# Patient Record
Sex: Female | Born: 1995 | Race: Black or African American | Hispanic: No | Marital: Single | Smoking: Former smoker
Health system: Southern US, Community
[De-identification: ages and names within clinical notes are randomized; demographics above are authoritative.]

## PROBLEM LIST (undated history)

## (undated) DIAGNOSIS — Z789 Other specified health status: Secondary | ICD-10-CM

## (undated) DIAGNOSIS — B3731 Acute candidiasis of vulva and vagina: Secondary | ICD-10-CM

## (undated) DIAGNOSIS — B373 Candidiasis of vulva and vagina: Secondary | ICD-10-CM

## (undated) HISTORY — PX: NO PAST SURGERIES: SHX2092

---
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
## 2013-06-23 ENCOUNTER — Emergency Department (HOSPITAL_COMMUNITY)
Admission: EM | Admit: 2013-06-23 | Discharge: 2013-06-23 | Disposition: A | Discharge status: Home / Self Care | Payer: No Typology Code available for payment source | Attending: Emergency Medicine | Admitting: Emergency Medicine

## 2013-06-23 ENCOUNTER — Encounter (HOSPITAL_COMMUNITY): Payer: Self-pay | Admitting: Emergency Medicine

## 2013-06-23 DIAGNOSIS — S0990XA Unspecified injury of head, initial encounter: Secondary | ICD-10-CM | POA: Insufficient documentation

## 2013-06-23 DIAGNOSIS — IMO0002 Reserved for concepts with insufficient information to code with codable children: Secondary | ICD-10-CM | POA: Insufficient documentation

## 2013-06-23 DIAGNOSIS — Y9389 Activity, other specified: Secondary | ICD-10-CM | POA: Insufficient documentation

## 2013-06-23 DIAGNOSIS — Y92410 Unspecified street and highway as the place of occurrence of the external cause: Secondary | ICD-10-CM | POA: Insufficient documentation

## 2013-06-23 MED ORDER — CYCLOBENZAPRINE HCL 10 MG PO TABS: 10.0000 mg | ORAL_TABLET | Freq: Two times a day (BID) | ORAL | Status: DC | PRN

## 2013-06-23 MED ORDER — ACETAMINOPHEN 500 MG PO TABS: 500.0000 mg | ORAL_TABLET | Freq: Four times a day (QID) | ORAL | Status: DC | PRN
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
## 2013-06-24 ENCOUNTER — Telehealth (HOSPITAL_COMMUNITY): Payer: Self-pay | Admitting: Emergency Medicine

## 2013-06-24 NOTE — ED Provider Notes (Signed)
Medical screening examination/treatment/procedure(s) were performed by non-physician practitioner and as supervising physician I was immediately available for consultation/collaboration.   Roney Marion, MD 06/24/13 2213

## 2014-02-06 ENCOUNTER — Encounter (HOSPITAL_COMMUNITY): Payer: Self-pay | Admitting: Emergency Medicine

## 2014-02-06 ENCOUNTER — Emergency Department (HOSPITAL_COMMUNITY)
Admission: EM | Admit: 2014-02-06 | Discharge: 2014-02-06 | Disposition: A | Discharge status: Home / Self Care | Payer: No Typology Code available for payment source | Attending: Emergency Medicine | Admitting: Emergency Medicine

## 2014-02-06 DIAGNOSIS — B9689 Other specified bacterial agents as the cause of diseases classified elsewhere: Secondary | ICD-10-CM | POA: Insufficient documentation

## 2014-02-06 DIAGNOSIS — D649 Anemia, unspecified: Secondary | ICD-10-CM

## 2014-02-06 DIAGNOSIS — N760 Acute vaginitis: Secondary | ICD-10-CM | POA: Insufficient documentation

## 2014-02-06 DIAGNOSIS — N926 Irregular menstruation, unspecified: Secondary | ICD-10-CM | POA: Insufficient documentation

## 2014-02-06 DIAGNOSIS — N939 Abnormal uterine and vaginal bleeding, unspecified: Secondary | ICD-10-CM | POA: Insufficient documentation

## 2014-02-06 DIAGNOSIS — A499 Bacterial infection, unspecified: Secondary | ICD-10-CM | POA: Insufficient documentation

## 2014-02-06 DIAGNOSIS — Z3202 Encounter for pregnancy test, result negative: Secondary | ICD-10-CM | POA: Insufficient documentation

## 2014-02-06 LAB — CBC WITH DIFFERENTIAL/PLATELET
BASOS ABS: 0 10*3/uL (ref 0.0–0.1)
BASOS PCT: 0 % (ref 0–1)
Eosinophils Absolute: 0.1 10*3/uL (ref 0.0–0.7)
Eosinophils Relative: 1 % (ref 0–5)
HEMATOCRIT: 32.6 % — AB (ref 36.0–46.0)
Hemoglobin: 10.6 g/dL — ABNORMAL LOW (ref 12.0–15.0)
Lymphocytes Relative: 41 % (ref 12–46)
Lymphs Abs: 2.6 10*3/uL (ref 0.7–4.0)
MCH: 27.8 pg (ref 26.0–34.0)
MCHC: 32.5 g/dL (ref 30.0–36.0)
MCV: 85.6 fL (ref 78.0–100.0)
MONO ABS: 0.4 10*3/uL (ref 0.1–1.0)
Monocytes Relative: 6 % (ref 3–12)
NEUTROS ABS: 3.3 10*3/uL (ref 1.7–7.7)
NEUTROS PCT: 52 % (ref 43–77)
PLATELETS: 191 10*3/uL (ref 150–400)
RBC: 3.81 MIL/uL — ABNORMAL LOW (ref 3.87–5.11)
RDW: 17.3 % — AB (ref 11.5–15.5)
WBC: 6.4 10*3/uL (ref 4.0–10.5)

## 2014-02-06 LAB — WET PREP, GENITAL
Trich, Wet Prep: NONE SEEN
YEAST WET PREP: NONE SEEN

## 2014-02-06 LAB — COMPREHENSIVE METABOLIC PANEL
ALBUMIN: 3.9 g/dL (ref 3.5–5.2)
ALT: 10 U/L (ref 0–35)
AST: 21 U/L (ref 0–37)
Alkaline Phosphatase: 72 U/L (ref 39–117)
BILIRUBIN TOTAL: 0.3 mg/dL (ref 0.3–1.2)
BUN: 10 mg/dL (ref 6–23)
CHLORIDE: 102 meq/L (ref 96–112)
CO2: 25 mEq/L (ref 19–32)
CREATININE: 0.72 mg/dL (ref 0.50–1.10)
Calcium: 9.5 mg/dL (ref 8.4–10.5)
GFR calc Af Amer: >90 mL/min (ref >90)
GFR calc non Af Amer: >90 mL/min (ref >90)
Glucose, Bld: 87 mg/dL (ref 70–99)
Potassium: 3.7 mEq/L (ref 3.7–5.3)
SODIUM: 138 meq/L (ref 137–147)
Total Protein: 7.4 g/dL (ref 6.0–8.3)

## 2014-02-06 LAB — LIPASE, BLOOD: Lipase: 36 U/L (ref 11–59)

## 2014-02-06 LAB — URINALYSIS, ROUTINE W REFLEX MICROSCOPIC
Bilirubin Urine: NEGATIVE
Glucose, UA: NEGATIVE mg/dL
Hgb urine dipstick: NEGATIVE
Ketones, ur: NEGATIVE mg/dL
Leukocytes, UA: NEGATIVE
Nitrite: NEGATIVE
Protein, ur: NEGATIVE mg/dL
Specific Gravity, Urine: 1.023 (ref 1.005–1.030)
Urobilinogen, UA: 0.2 mg/dL (ref 0.0–1.0)
pH: 7.5 (ref 5.0–8.0)

## 2014-02-06 LAB — POC URINE PREG, ED: Preg Test, Ur: NEGATIVE

## 2014-02-06 MED ORDER — METRONIDAZOLE 500 MG PO TABS: 500.0000 mg | ORAL_TABLET | Freq: Two times a day (BID) | ORAL | Status: DC

## 2014-02-06 NOTE — Progress Notes (Signed)
[facility] provided pt with a list of primary care resources, highlighting [facility].  °

## 2014-02-06 NOTE — ED Notes (Addendum)
Pt reports that she had light period prior to this one, but now has been having vaginal bleeding for 3 weeks with L side pain. Denies lightheadedness. Has been having diarrhea for 2-3 days.

## 2014-02-06 NOTE — ED Provider Notes (Signed)
CSN: [id number]     Arrival date & time 02/06/14  1340 History   First MD Initiated Contact with Patient 02/06/14 1504     Chief Complaint  Patient presents with  . Vaginal Bleeding  . Abdominal Pain     (Consider location/radiation/quality/duration/timing/severity/associated sxs/prior Treatment) HPI  Patient presents with abnormal vaginal bleeding.  States she had some spotting followed by her normal period April 22, she now has had constant vaginal bleeding for the past 3 weeks.  Bleeding is at times very light and at times like a normal period, currently it is very light.  She is using tampons, changing every 3-4 hours, very light.  Occasional sharp abdominal pains, occuring about twice daily, sharp pain, lasting seconds.  Denies fevers, lightheadedness, dizziness, weakness, SOB.  Denies abnormal vaginal discharge besides the bleeding.  Denies urinary or bowel symptoms.  She is sexually active, uses condoms only for protection.   History reviewed. No pertinent past medical history. History reviewed. No pertinent past surgical history. History reviewed. No pertinent family history. History  Substance Use Topics  . Smoking status: Never Smoker   . Smokeless tobacco: Never Used  . Alcohol Use: No   OB History   Grav Para Term Preterm Abortions TAB SAB Ect Mult Living                 Review of Systems  All other systems reviewed and are negative.     Allergies  Aspirin  Home Medications   Prior to Admission medications   Medication Sig Start Date End Date Taking? Authorizing Provider  ibuprofen (ADVIL,MOTRIN) 200 MG tablet Take 400 mg by mouth every 6 (six) hours as needed for moderate pain.   Yes Historical Provider, MD   BP 119/81  Pulse 117  Temp(Src) 98.1 F (36.7 C) (Oral)  Resp 20  SpO2 100% Physical Exam  Nursing note and vitals reviewed. Constitutional: She appears well-developed and well-nourished. No distress.  HENT:  Head: Normocephalic and atraumatic.   Neck: Neck supple.  Cardiovascular: Normal rate and regular rhythm.   Pulmonary/Chest: Effort normal and breath sounds normal. No respiratory distress. She has no wheezes. She has no rales.  Abdominal: Soft. She exhibits no distension and no mass. There is no tenderness. There is no rebound, no guarding and no CVA tenderness.  Genitourinary: Uterus is not tender. Cervix exhibits no motion tenderness and no friability. Right adnexum displays no mass, no tenderness and no fullness. Left adnexum displays no mass, no tenderness and no fullness. There is bleeding around the vagina. No erythema or tenderness around the vagina. No foreign body around the vagina. No signs of injury around the vagina. No vaginal discharge found.  Small amount of red blood and small amount of mucus   Neurological: She is alert.  Skin: She is not diaphoretic.  Psychiatric: She has a normal mood and affect. Her behavior is normal.    ED Course  Procedures (including critical care time) Labs Review Labs Reviewed  WET PREP, GENITAL - Abnormal; Notable for the following:    Clue Cells Wet Prep HPF POC MANY (*)    WBC, Wet Prep HPF POC FEW (*)    All other components within normal limits  CBC WITH DIFFERENTIAL - Abnormal; Notable for the following:    RBC 3.81 (*)    Hemoglobin 10.6 (*)    HCT 32.6 (*)    RDW 17.3 (*)    All other components within normal limits  GC/CHLAMYDIA PROBE AMP  URINALYSIS, ROUTINE W REFLEX MICROSCOPIC  COMPREHENSIVE METABOLIC PANEL  LIPASE, BLOOD  HIV ANTIBODY (ROUTINE TESTING)  POC URINE PREG, ED    Imaging Review No results found.   EKG Interpretation None      Filed Vitals:   02/06/14 1548  BP: 109/69  Pulse: 63  Temp: 98 F (36.7 C)  Resp: 16     MDM   Final diagnoses:  Abnormal vaginal bleeding  Anemia  BV (bacterial vaginosis)    Pt with abnormal vaginal bleeding.  She is not pregnant.  She is anemic, Hgb 10.6 with no prior value available to me. VS normal.  Wet prep + for BV.  D/C home with flagyl, [facility]  Discussed result, findings, treatment, and follow up  with patient.  Pt given return precautions.  Pt verbalizes understanding and agrees with plan.        Trixie Dredgemily Filiberto Wamble, PA-C 02/06/14 2158

## 2014-02-06 NOTE — ED Provider Notes (Signed)
Medical screening examination/treatment/procedure(s) were performed by non-physician practitioner and as supervising physician I was immediately available for consultation/collaboration.   EKG Interpretation None        Dagmar Hait, MD 02/06/14 [phone]

## 2014-02-06 NOTE — Discharge Instructions (Signed)
Read the information below.  Use the prescribed medication as directed.  Please discuss all new medications with your pharmacist.  You may return to the Emergency Department at any time for worsening condition or any new symptoms that concern you.  If you develop high fevers, worsening abdominal pain, uncontrolled bleeding, you feel lightheaded, weak, or you pass out, go directly to [facility] for a recheck.     Abnormal Uterine Bleeding Abnormal uterine bleeding means bleeding from the vagina that is not your normal menstrual period. This can be:  Bleeding or spotting between periods.  Bleeding after sex (sexual intercourse).  Bleeding that is heavier or more than normal.  Periods that last longer than usual.  Bleeding after menopause. There are many problems that may cause this. Treatment will depend on the cause of the bleeding. Any kind of bleeding that is not normal should be reviewed by your doctor.  HOME CARE Watch your condition for any changes. These actions may lessen any discomfort you are having:  Do not use tampons or douches as told by your doctor.  Change your pads often. You should get regular pelvic exams and Pap tests. Keep all appointments for tests as told by your doctor. GET HELP IF:  You are bleeding for more than 1 week.  You feel dizzy at times. GET HELP RIGHT AWAY IF:   You pass out.  You have to change pads every 15 to 30 minutes.  You have belly pain.  You have a fever.  You become sweaty or weak.  You are passing large blood clots from the vagina.  You feel sick to your stomach (nauseous) and throw up (vomit). MAKE SURE YOU:  Understand these instructions.  Will watch your condition.  Will get help right away if you are not doing well or get worse. Document Released: 06/25/2009 Document Revised: 06/18/2013 Document Reviewed: 03/27/2013 [facility] Patient Information 2014 [place].  Bacterial Vaginosis Bacterial vaginosis  is a vaginal infection that occurs when the normal balance of bacteria in the vagina is disrupted. It results from an overgrowth of certain bacteria. This is the most common vaginal infection in women of childbearing age. Treatment is important to prevent complications, especially in pregnant women, as it can cause a premature delivery. CAUSES  Bacterial vaginosis is caused by an increase in harmful bacteria that are normally present in smaller amounts in the vagina. Several different kinds of bacteria can cause bacterial vaginosis. However, the reason that the condition develops is not fully understood. RISK FACTORS Certain activities or behaviors can put you at an increased risk of developing bacterial vaginosis, including:  Having a new sex partner or multiple sex partners.  Douching.  Using an intrauterine device (IUD) for contraception. Women do not get bacterial vaginosis from toilet seats, bedding, swimming pools, or contact with objects around them. SIGNS AND SYMPTOMS  Some women with bacterial vaginosis have no signs or symptoms. Common symptoms include:  Grey vaginal discharge.  A fishlike odor with discharge, especially after sexual intercourse.  Itching or burning of the vagina and vulva.  Burning or pain with urination. DIAGNOSIS  Your health care provider will take a medical history and examine the vagina for signs of bacterial vaginosis. A sample of vaginal fluid may be taken. Your health care provider will look at this sample under a microscope to check for bacteria and abnormal cells. A vaginal pH test may also be done.  TREATMENT  Bacterial vaginosis may be treated with antibiotic medicines.  These may be given in the form of a pill or a vaginal cream. A second round of antibiotics may be prescribed if the condition comes back after treatment.  HOME CARE INSTRUCTIONS   Only take over-the-counter or prescription medicines as directed by your health care provider.  If  antibiotic medicine was prescribed, take it as directed. Make sure you finish it even if you start to feel better.  Do not have sex until treatment is completed.  Tell all sexual partners that you have a vaginal infection. They should see their health care provider and be treated if they have problems, such as a mild rash or itching.  Practice safe sex by using condoms and only having one sex partner. SEEK MEDICAL CARE IF:   Your symptoms are not improving after 3 days of treatment.  You have increased discharge or pain.  You have a fever. MAKE SURE YOU:   Understand these instructions.  Will watch your condition.  Will get help right away if you are not doing well or get worse. FOR MORE INFORMATION  Centers for Disease Control and Prevention, Division of STD Prevention: SolutionApps.co.za American Sexual Health Association (ASHA): www.ashastd.org  Document Released: 08/28/2005 Document Revised: 06/18/2013 Document Reviewed: 04/09/2013 [facility] Patient Information 2014 [place], [place].   Emergency Department Resource Guide 1) Find a Doctor and Pay Out of Pocket Although you won't have to find out who is covered by your insurance plan, it is a good idea to ask around and get recommendations. You will then need to call the office and see if the doctor you have chosen will accept you as a new patient and what types of options they offer for patients who are self-pay. Some doctors offer discounts or will set up payment plans for their patients who do not have insurance, but you will need to ask so you aren't surprised when you get to your appointment.  2) Contact Your Local Health Department Not all health departments have doctors that can see patients for sick visits, but many do, so it is worth a call to see if yours does. If you don't know where your local health department is, you can check in your phone book. The CDC also has a tool to help you locate your state's health department,  and many state websites also have listings of all of their local health departments.  3) Find a Walk-in Clinic If your illness is not likely to be very severe or complicated, you may want to try a walk in clinic. These are popping up all over the country in pharmacies, drugstores, and shopping centers. They're usually staffed by nurse practitioners or physician assistants that have been trained to treat common illnesses and complaints. They're usually fairly quick and inexpensive. However, if you have serious medical issues or chronic medical problems, these are probably not your best option.  No Primary Care Doctor: - Call Health Connect at  [phone] - they can help you locate a primary care doctor that  accepts your insurance, provides certain services, etc. - Physician Referral Service- [phone]  Chronic Pain Problems: Organization         Address  Phone   Notes  [facility]  [phone] Patients need to be referred by their primary care doctor.   Medication Assistance: Organization         Address  Phone   Notes  [facility] Medication Assistance Program [address] [place], [place] [address] [phone]  [phone] --Must be a resident of [facility] -- Must have NO insurance coverage whatsoever (no Medicaid/ Medicare, etc.) -- The pt. MUST have a primary care doctor that directs their care regularly and follows them in the community   [facility]ist  [phone]   [employer]  [phone]    Agencies that provide inexpensive medical care: Organization         Address  Phone   Notes  [facility]  [phone]   [facility]    [phone]   [facility] [address] [place], [place] [address] [phone]   [facility] [address] [place] [place] [phone]   Planned Parenthood    [phone]   [facility]    [phone]   Community  Health and [facility]  [address], [place] Babinski [place] Phone:  [phone], Fax:  [phone] Hours of Operation:  9 am - 6 pm, M-F.  Also accepts Medicaid/Medicare and self-pay.  [facility]  [address], [place] Phone: [phone], Fax: [phone]. Hours of Operation:  8:30 am - 5:30 pm, M-F.  Also accepts Medicaid and self-pay.  [facility] [place] [address], [place] Phone: [phone]   [facility] [address] Natasha Bence [place], [place] [phone], Ext. [phone] Mondays & Thursdays: 7-9 AM.  First 15 patients are seen on a first come, first serve basis.    Medicaid-accepting [facility] Providers:  Organization         Address  Phone   Notes  [facility] [address], [place] [phone] Also accepts self-pay patients.  [facility] [address] Laurell Josephs [place], [place]  [phone]   [facility] [address], [place] [phone]   [facility] Medicine [address], [place] [phone]   Renaye Rakers [address], [place]   [phone] Only accepts [place] Access [place] patients after they have their name applied to their card.   Self-Pay (no insurance) in [facility]:  Organization         Address  Phone   Notes  Sickle Cell Patients, [facility] [address] [place], [place] [phone]   [facility] Care [address] [place], [place] [phone]   [facility]  [address], [place] [phone]   [facility]/Dr. Osei-Bonsu  [address] [place] or [address], [place] [phone] Phone number for both [place] and [place] locations is the same.  Urgent Medical and [facility] [address], [place] [phone]   [facility] [address] [place] or [address] [phone]   [facility] [address] [place] [phone], phone; [phone], fax Sees patients 1st and 3rd Saturday of every month.  Must not qualify for public or private insurance (i.e. Medicaid, Medicare, [place] Health Choice, Veterans' Benefits)  Household income should be no more than 200% of the poverty level The clinic cannot treat you if you are pregnant or think you are pregnant  Sexually transmitted diseases are not treated at the clinic.    Dental Care: Organization  Address  Phone  Notes  [facility] Department of [facility] [address] [place], [place] [phone] Accepts children up to age 58 who are enrolled in [place] or [place] Health Choice; pregnant women with a Medicaid card; and children who have applied for Medicaid or [place] Health Choice, but were declined, whose parents can pay a reduced fee at time of service.  [facility] Department of [facility]  [address], [place] [phone] Accepts children up to age 37 who are enrolled in [place] or [place] Health Choice; pregnant women with a Medicaid card; and children who have applied for Medicaid or [place] Health Choice, but were declined, whose parents can pay a reduced fee at time of service.  [facility]  [address] [place], [place] [phone] Patients are seen by appointment only. Walk-ins are not accepted. [facility] will see patients 36 years of age and older. Monday - Tuesday (8am-5pm) Most Wednesdays (8:30-5pm) $30 per visit, cash only  [facility] Adult Dental Access PRO[facility]  [address], [facility] [phone] Patients are seen by appointment only. Walk-ins are not accepted. [facility] will see patients 43 years of age and older. One Wednesday Evening (Monthly: Volunteer Based).  $30 per visit,  cash only  [employer]  [phone] for adults; Children under age 34, call Graduate Pediatric Dentistry at [phone]. Children aged 16-14, please call [phone] to request a pediatric application.  Dental services are provided in all areas of dental care including fillings, crowns and bridges, complete and partial dentures, implants, gum treatment, root canals, and extractions. Preventive care is also provided. Treatment is provided to both adults and children. Patients are selected via a lottery and there is often a waiting list.   [facility] [address], [employer]  [phone] www.drcivils.com   [facility] [address] [place], [place] [phone], Ext. [phone] Second and Fourth Thursday of each month, opens at 6:30 AM; Clinic ends at 9 AM.  Patients are seen on a first-come first-served basis, and a limited number are seen during each clinic.   [facility]  [address] Ether Griffins [place], [place] [phone]   Eligibility Requirements You must have lived in [place], [place], or [place] counties for at least the last three months.   You cannot be eligible for state or federal sponsored [place], including [employer], [place], or [employer].   You generally cannot be eligible for healthcare insurance through your employer.    How to apply: Eligibility screenings are held every Tuesday and Wednesday afternoon from 1:00 pm until 4:00 pm. You do not need an appointment for the interview!  [facility] [address] [place], [place] [phone]   [facility] Health Department  [phone]   [facility] Health Department  [phone]   [facility] Health Department  [phone]    Behavioral Health Resources in the Community: Intensive Outpatient Programs Organization         Address  Phone  Notes  [facility] Services [address],  [place], [place] [phone]   [facility] Outpatient [address], [place], [place] [phone]   [facility]: [facility] [address], [place], [place]  [phone]   [facility] Mental Health [address],  [place], [place] [phone] or [phone]   Substance Abuse Resources  Organization         Address  Phone  Notes  Al[facility] Drug Services  [phone]   [facility]  [phone]   [facility] [place]  [phone]   Floydene Flock  [phone]   Residential & Outpatient Substance Abuse Program  [phone]   Psychological Services Organization         Address  Phone  Notes  [facility] Behavior[facility] Health  [phone]   [facility] Services  [phone]   [facility] Mental Health [address], [place] [phone] or [phone]    Mobile Crisis Teams Organization         Address  Phone  Notes  [facility], Mobile Crisis Care Unit  [phone]   [facility]  [address] [place], [place] [phone]   Doristine Locks [address] [place] [phone]    Self-Help/Support Groups Organization         Address  Phone             Notes  [facility] [place] - variety of support groups  [phone]- I7437963 Call for more information  Narcotics Anonymous (NA), [facility] [address], [employer] [place]  2 meetings at this location   Statistician         Address  Phone  Notes  [facility] [address],    [place]  [phone]   [facility]  [address] [place] [address], [place], [place] [phone]   [facility] [address] [place], [place] [phone] Admissions: 8am-3pm M-F  [facility] [address]    [place], [place] [phone]   [facility] [address] [place], [place], [place] [phone]   [facility] [address]  [place], [place] [phone]   [facility] - Intensive Outpatient [address] Laurell Josephs [address], [place], [place] [phone]   [facility] ([facility] [address] [place].,  [place], [place] [phone] or [phone]   [facility] ([facility]) [address], [place], [place] [phone] Accepts Medicaid  [facility] [address],  [place] [phone] Substance Abuse/Addiction Treatment   [facility] Organization         Address  Phone  Notes  [facility]  [phone]   Angie Fava, PhD [address] Ervin Knack [place], [place]   [phone] or [phone]   [facility]al   [address] [place], [place] [phone]   [facility] [address], [place], [place] [phone] Insurance/Medicaid/sponsorship through [facility] and Families [address]                                    [place], [place] [phone] Therapy/tele-psych/case  [facility] [address] [place] [address], [place] [phone]    Dr. Lolly Mustache  [phone]   [facility]  [employer] [facility] Dept. 1) [address], [place] 2) [address] [place] 3)  [address], [place] [phone]  [phone]   [facility] Child Abuse Hotline 2516076368 or 7637086557 (After Hours)

## 2014-02-07 LAB — GC/CHLAMYDIA PROBE AMP
CT Probe RNA: POSITIVE — AB
GC Probe RNA: NEGATIVE

## 2014-02-07 LAB — HIV ANTIBODY (ROUTINE TESTING W REFLEX): HIV 1&2 Ab, 4th Generation: NONREACTIVE

## 2014-02-08 ENCOUNTER — Telehealth (HOSPITAL_BASED_OUTPATIENT_CLINIC_OR_DEPARTMENT_OTHER): Payer: Self-pay | Admitting: Emergency Medicine

## 2014-02-08 NOTE — Telephone Encounter (Signed)
+  Chlamydia. Chart sent to EDP office for review. DHHS attached. 

## 2014-02-10 NOTE — Telephone Encounter (Signed)
Rx for Azithromycin 1000 mg PO once, presribed by Rhea Bleacher PA-C, [facility] [phone]) by Norm Parcel PFM.

## 2014-04-02 ENCOUNTER — Emergency Department (HOSPITAL_COMMUNITY): Payer: Self-pay

## 2014-04-02 ENCOUNTER — Emergency Department (HOSPITAL_COMMUNITY)
Admission: EM | Admit: 2014-04-02 | Discharge: 2014-04-02 | Disposition: A | Discharge status: Home / Self Care | Payer: Self-pay | Attending: Emergency Medicine | Admitting: Emergency Medicine

## 2014-04-02 ENCOUNTER — Encounter (HOSPITAL_COMMUNITY): Payer: Self-pay | Admitting: Emergency Medicine

## 2014-04-02 DIAGNOSIS — Y92838 Other recreation area as the place of occurrence of the external cause: Secondary | ICD-10-CM

## 2014-04-02 DIAGNOSIS — S93609A Unspecified sprain of unspecified foot, initial encounter: Secondary | ICD-10-CM | POA: Insufficient documentation

## 2014-04-02 DIAGNOSIS — W1692XA Jumping or diving into unspecified water causing other injury, initial encounter: Secondary | ICD-10-CM | POA: Insufficient documentation

## 2014-04-02 DIAGNOSIS — S99929A Unspecified injury of unspecified foot, initial encounter: Secondary | ICD-10-CM

## 2014-04-02 DIAGNOSIS — S99919A Unspecified injury of unspecified ankle, initial encounter: Secondary | ICD-10-CM

## 2014-04-02 DIAGNOSIS — Z792 Long term (current) use of antibiotics: Secondary | ICD-10-CM | POA: Insufficient documentation

## 2014-04-02 DIAGNOSIS — IMO0002 Reserved for concepts with insufficient information to code with codable children: Secondary | ICD-10-CM

## 2014-04-02 DIAGNOSIS — Z23 Encounter for immunization: Secondary | ICD-10-CM | POA: Insufficient documentation

## 2014-04-02 DIAGNOSIS — Y9339 Activity, other involving climbing, rappelling and jumping off: Secondary | ICD-10-CM | POA: Insufficient documentation

## 2014-04-02 DIAGNOSIS — S91009A Unspecified open wound, unspecified ankle, initial encounter: Principal | ICD-10-CM

## 2014-04-02 DIAGNOSIS — S81009A Unspecified open wound, unspecified knee, initial encounter: Secondary | ICD-10-CM | POA: Insufficient documentation

## 2014-04-02 DIAGNOSIS — S81809A Unspecified open wound, unspecified lower leg, initial encounter: Principal | ICD-10-CM

## 2014-04-02 DIAGNOSIS — Y9239 Other specified sports and athletic area as the place of occurrence of the external cause: Secondary | ICD-10-CM | POA: Insufficient documentation

## 2014-04-02 DIAGNOSIS — S93602A Unspecified sprain of left foot, initial encounter: Secondary | ICD-10-CM

## 2014-04-02 DIAGNOSIS — S8990XA Unspecified injury of unspecified lower leg, initial encounter: Secondary | ICD-10-CM | POA: Insufficient documentation

## 2014-04-02 MED ORDER — TETANUS-DIPHTH-ACELL PERTUSSIS 5-2.5-18.5 LF-MCG/0.5 IM SUSP
0.5000 mL | Freq: Once | INTRAMUSCULAR | Status: AC
  Administered 2014-04-02: 0.5 mL via INTRAMUSCULAR
  Filled 2014-04-02: qty 0.5

## 2014-04-02 MED ORDER — HYDROCODONE-ACETAMINOPHEN 5-325 MG PO TABS: 1.0000 | ORAL_TABLET | Freq: Four times a day (QID) | ORAL | Status: DC | PRN

## 2014-04-02 MED ORDER — HYDROCODONE-ACETAMINOPHEN 5-325 MG PO TABS
1.0000 | ORAL_TABLET | Freq: Once | ORAL | Status: AC
  Administered 2014-04-02: 1 via ORAL
  Filled 2014-04-02: qty 1

## 2014-04-02 NOTE — ED Provider Notes (Signed)
CSN: [id number]     Arrival date & time 04/02/14  2109 History  This chart was scribed for non-physician practitioner, Fayrene HelperBowie Blessed Cotham, PA-C, working with Vanetta MuldersScott Zackowski, MD, by Bronson CurbJacqueline Melvin, ED Scribe. This patient was seen in room TR07C/TR07C and the patient's care was started at 10:49 PM.     Chief Complaint  Patient presents with  . Foot Pain      The history is provided by the patient. No language interpreter was used.    HPI Comments: Annette Daniel is a 18 y.o. female who presents to the Emergency Department complaining of left foot pain that occurred today at 1800. Pt states she jumped into the shallow end of a pool and felt impact to the top of her left foot. Report acute onset of sharp, non radiating L foot pain, intense, unable to ambulate afterward.  There is associated swelling, bruising, and 2 small abrasions to left foot. She denies left ankle or knee pain. Patient has no history of significant health conditions. Patient is not UTD on tetanus.   History reviewed. No pertinent past medical history. History reviewed. No pertinent past surgical history. No family history on file. History  Substance Use Topics  . Smoking status: Never Smoker   . Smokeless tobacco: Never Used  . Alcohol Use: No   OB History   Grav Para Term Preterm Abortions TAB SAB Ect Mult Living                 Review of Systems  Constitutional: Negative for fever and chills.  Musculoskeletal: Positive for myalgias (left foot).      Allergies  Aspirin  Home Medications   Prior to Admission medications   Medication Sig Start Date End Date Taking? Authorizing Provider  ibuprofen (ADVIL,MOTRIN) 200 MG tablet Take 400 mg by mouth every 6 (six) hours as needed for moderate pain.    Historical Provider, MD  metroNIDAZOLE (FLAGYL) 500 MG tablet Take 1 tablet (500 mg total) by mouth 2 (two) times daily. One po bid x 7 days 02/06/14   Trixie DredgeEmily West, PA-C   Triage Vitals: BP 126/78  Pulse 90   Temp(Src) 98 F (36.7 C) (Oral)  Resp 16  SpO2 100%  LMP 03/19/2014  Physical Exam  Nursing note and vitals reviewed. Constitutional: She is oriented to person, place, and time. She appears well-developed and well-nourished. No distress.  HENT:  Head: Normocephalic and atraumatic.  Eyes: Conjunctivae and EOM are normal.  Neck: Neck supple. No tracheal deviation present.  Cardiovascular: Normal rate.   Pulmonary/Chest: Effort normal. No respiratory distress.  Musculoskeletal: Normal range of motion. She exhibits tenderness.  Left ankle nontender to palpation. Left foot is tender to lateral dorsum of left foot. Moderate size ecchymosis to Lateral dorsum of foot.  Superficial abrasion to top of left foot adjacent to 4th and 5th toe. Brisk cap refill. Sensation intact.  Neurological: She is alert and oriented to person, place, and time.  Skin: Skin is warm and dry.  Psychiatric: She has a normal mood and affect. Her behavior is normal.    ED Course  Procedures (including critical care time)  DIAGNOSTIC STUDIES: Oxygen Saturation is 100% on room air, normal by my interpretation.    COORDINATION OF CARE: At 2252 L foot injury, xray neg for acute fx.  Likely foot sprain with associated skin tear.  Discussed treatment plan with patient which includes Tdap. Patient also advised to ice the area twice and a day and apply OTC neosporin twice  a day. Patient agrees. Crutches given for support.  ACE wrap and ice pack provided.    Labs Review Labs Reviewed - No data to display  Imaging Review Dg Foot Complete Left  04/02/2014   CLINICAL DATA:  Jumped in shallow pool; left foot pain and lateral swelling.  EXAM: LEFT FOOT - COMPLETE 3+ VIEW  COMPARISON:  None.  FINDINGS: There is no evidence of fracture or dislocation. The joint spaces are preserved. There is no evidence of talar subluxation; the subtalar joint is unremarkable in appearance.  No significant soft tissue abnormalities are seen.   IMPRESSION: No evidence of fracture or dislocation.   Electronically Signed   By: Roanna Raider M.D.   On: 04/02/2014 22:13     EKG Interpretation None      MDM   Final diagnoses:  Skin tear  Foot sprain, left, initial encounter    BP 126/78  Pulse 90  Temp(Src) 98 F (36.7 C) (Oral)  Resp 16  SpO2 100%  LMP 03/19/2014  I have reviewed nursing notes and vital signs. I personally reviewed the imaging tests through PACS system  I reviewed available ER/hospitalization records thought the EMR   I personally performed the services described in this documentation, which was scribed in my presence. The recorded information has been reviewed and is accurate.     Fayrene Helper, PA-C 04/02/14 2301

## 2014-04-02 NOTE — Discharge Instructions (Signed)
Apply neosporin to skin tear twice daily for 1 week.  Keep foot elevated when rested.  Apply ice pack to foot for no longer than 20 minutes as needed.  Use crutches as needed.  Follow up with foot specialist if no improvement in 1 week.    Sprain A sprain is an injury to the soft tissue that connects adjacent bones across a joint (ligament), in which the ligament becomes stretched or torn. The purpose of ligaments is to prevent a joint from moving out side of its intended range of motion. The most common joints of the body to suffer from a sprain are the ankles, knees, and fingers. Sprains are classified into 3 categories: grade 1, grade 2, and grade 3. Grade 1 sprains cause pain, but the tendon is not lengthened. Grade 2 sprains include a lengthened ligament due to the ligament being stretched or partially ruptured. With grade 2 sprains there is still function, although the function may be diminished. Grade 3 sprains are marked by a complete tear of the ligament and the joint usually displays a loss of function.  SYMPTOMS   Pain and tenderness in the area of injury; severity varies with extent of injury.  Swelling of the affected joint (usually).  Redness or bruising in the area of injury, either immediately or several hours after the injury.  Loss of normal mobility of the injured joint. CAUSES  A sprain may occur as a secondary injury to a traumatic event, such as a fall or twisting injury. The ankle is susceptible to sprains because of it is a mechanically weak joint and is exposed during athletic events. RISK INCREASES WITH:  Trauma, especially with high-risk activities, such as sports with a lot of jumping, for knee and ankle sprains (basketball or volleyball); sports with a lot of pivoting motions, for knee sprains (skiing, soccer, or football); and contact sports.  Falls onto outstretched hands and wrists (wrist sprains).  Catching sports, such as water polo and baseball (finger  sprains).  Poorly fitting and high-heeled shoes.  Poor field conditions.  Poor strength and flexibility.  Failure to warm-up properly before activity. PREVENTION  Warm up and stretch properly before activity.  Maintain physical fitness:  Muscle strength.  Endurance and flexibility.  Cardiovascular fitness.  Wear properly fitted and padded protective equipment.  Wrap weak joints with support bandages before strenuous activity. PROGNOSIS  If treated properly, sprains usually heal in 2 to 8 weeks. Occasionally sprains require surgery for healing to occur. RELATED COMPLICATIONS  Permanent instability of a joint if the sprain is severe or if a ligament is repeatedly sprained.  Arthritis of the joint. TREATMENT Treatment involves ice and medicine to relieve pain and inflammation. Rest and immobilization of the injured joint is necessary for healing to occur. Strengthening and stretching exercises may be recommended after immobilization to regain strength and a full range of motion. For severe sprains surgery may be necessary to repair the injured ligament. MEDICATION  If pain medicine is necessary, then nonsteroidal anti-inflammatory medicines, such as aspirin and ibuprofen, or other minor pain relievers, such as acetaminophen, are often recommended.  Do not take pain medicine for 7 days before surgery.  Prescription pain relievers may be prescribed. Use only as directed and only as much as you need.  Cortisone injections are generally not advised for sprains. Cortisone may affect the healing of the ligament. HEAT AND COLD  Cold treatment (icing) relieves pain and reduces inflammation. Cold treatment should be applied for 10 to 15  minutes every 2 to 3 hours for inflammation and pain and immediately after any activity that aggravates your symptoms. Use ice packs or massage the area with a piece of ice (ice massage).  Heat treatment may be used prior to performing the stretching  and strengthening activities prescribed by your caregiver, physical therapist, or athletic trainer. Use a heat pack or soak the injury in warm water. SEEK MEDICAL CARE IF:  Symptoms get worse or do not improve in 2 to 6 weeks despite treatment. Document Released: 08/28/2005 Document Revised: 11/20/2011 Document Reviewed: 12/10/2008 [facility] Patient Information 2015 [place], [place]. This information is not intended to replace advice given to you by your health care provider. Make sure you discuss any questions you have with your health care provider.
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
## 2014-04-03 NOTE — ED Provider Notes (Signed)
Medical screening examination/treatment/procedure(s) were performed by non-physician practitioner and as supervising physician I was immediately available for consultation/collaboration.   EKG Interpretation None        Vanetta MuldersScott Sharay Bellissimo, MD 04/03/14 [phone]
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
## 2015-01-11 ENCOUNTER — Encounter (HOSPITAL_COMMUNITY): Payer: Self-pay | Admitting: *Deleted

## 2015-01-11 ENCOUNTER — Inpatient Hospital Stay (HOSPITAL_COMMUNITY)
Admission: AD | Admit: 2015-01-11 | Discharge: 2015-01-12 | Disposition: A | Discharge status: Home / Self Care | Payer: Self-pay | Source: Ambulatory Visit | Attending: Obstetrics & Gynecology | Admitting: Obstetrics & Gynecology

## 2015-01-11 DIAGNOSIS — B373 Candidiasis of vulva and vagina: Secondary | ICD-10-CM | POA: Insufficient documentation

## 2015-01-11 DIAGNOSIS — B3731 Acute candidiasis of vulva and vagina: Secondary | ICD-10-CM

## 2015-01-11 HISTORY — DX: Acute candidiasis of vulva and vagina: B37.31

## 2015-01-11 HISTORY — DX: Candidiasis of vulva and vagina: B37.3

## 2015-01-11 LAB — URINE MICROSCOPIC-ADD ON

## 2015-01-11 LAB — URINALYSIS, ROUTINE W REFLEX MICROSCOPIC
Bilirubin Urine: NEGATIVE
Glucose, UA: NEGATIVE mg/dL
KETONES UR: 15 mg/dL — AB
Leukocytes, UA: NEGATIVE
NITRITE: NEGATIVE
PROTEIN: 100 mg/dL — AB
Specific Gravity, Urine: >1.03 — ABNORMAL HIGH (ref 1.005–1.030)
UROBILINOGEN UA: 0.2 mg/dL (ref 0.0–1.0)
pH: 6 (ref 5.0–8.0)

## 2015-01-11 LAB — WET PREP, GENITAL: Trich, Wet Prep: NONE SEEN

## 2015-01-11 LAB — POCT PREGNANCY, URINE: Preg Test, Ur: NEGATIVE
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
## 2015-01-12 DIAGNOSIS — B373 Candidiasis of vulva and vagina: Secondary | ICD-10-CM

## 2015-01-12 MED ORDER — TERCONAZOLE 0.4 % VA CREA: 1.0000 | TOPICAL_CREAM | Freq: Every day | VAGINAL | Status: DC

## 2015-01-12 NOTE — MAU Provider Note (Signed)
History     CSN: [id number]  Arrival date and time: 01/11/15 2231   First Provider Initiated Contact with Patient 01/12/15 0006      No chief complaint on file.  HPI Comments: Annette Daniel is a 19 y.o .G0P0000 who presents today with yeast infection. She states that has had about 5 yeast infections since October. She has been on Flagyl, and diflucan, but states that neither of those have helped.   Vaginal Discharge The patient's primary symptoms include genital itching and vaginal discharge. This is a recurrent (She states that she has had about 5 yeast infections since October ) problem. The problem occurs constantly. The problem has been unchanged. The patient is experiencing no pain. She is not pregnant. Pertinent negatives include no abdominal pain, dysuria, fever, frequency or urgency. The vaginal discharge was white. The vaginal bleeding is spotting. She has tried antifungals (flagyl ) for the symptoms. The treatment provided mild relief. She is sexually active. No, her partner does not have an STD.    Past Medical History  Diagnosis Date  . Vaginal yeast infection     History reviewed. No pertinent past surgical history.  History reviewed. No pertinent family history.  History  Substance Use Topics  . Smoking status: Never Smoker   . Smokeless tobacco: Never Used  . Alcohol Use: No    Allergies:  Allergies  Allergen Reactions  . Aspirin Swelling    Prescriptions prior to admission  Medication Sig Dispense Refill Last Dose  . metroNIDAZOLE (FLAGYL) 500 MG tablet Take 500 mg by mouth 3 (three) times daily.   Past Month at 14/17/2016  . HYDROcodone-acetaminophen (NORCO/VICODIN) 5-325 MG per tablet Take 1 tablet by mouth every 6 (six) hours as needed for moderate pain or severe pain. 12 tablet 0 More than a month at Unknown time  . ibuprofen (ADVIL,MOTRIN) 200 MG tablet Take 400 mg by mouth every 6 (six) hours as needed for moderate pain.   More than a month at  Unknown time  . metroNIDAZOLE (FLAGYL) 500 MG tablet Take 1 tablet (500 mg total) by mouth 2 (two) times daily. One po bid x 7 days 14 tablet 0 More than a month at Unknown time    Review of Systems  Constitutional: Negative for fever.  Gastrointestinal: Negative for abdominal pain.  Genitourinary: Positive for vaginal discharge. Negative for dysuria, urgency and frequency.   Physical Exam   Blood pressure 110/72, pulse 70, temperature 98.1 F (36.7 C), temperature source Oral, resp. rate 20, height  (1.575 m), weight 53.581 kg (118 lb 2 oz), last menstrual period 12/22/2014.  Physical Exam  Nursing note and vitals reviewed. Constitutional: She is oriented to person, place, and time. She appears well-developed and well-nourished. No distress.  Cardiovascular: Normal rate.   Respiratory: Effort normal.  GI: Soft. There is no tenderness.  Neurological: She is alert and oriented to person, place, and time.  Skin: Skin is warm and dry.  Psychiatric: She has a normal mood and affect.     Results for orders placed or performed during the hospital encounter of 01/11/15 (from the past 24 hour(s))  Urinalysis, Routine w reflex microscopic     Status: Abnormal   Collection Time: 01/11/15 11:13 PM  Result Value Ref Range   Color, Urine YELLOW YELLOW   APPearance CLEAR CLEAR   Specific Gravity, Urine >1.030 (H) 1.005 - 1.030   pH 6.0 5.0 - 8.0   Glucose, UA NEGATIVE NEGATIVE mg/dL   Hgb urine  dipstick SMALL (A) NEGATIVE   Bilirubin Urine NEGATIVE NEGATIVE   Ketones, ur 15 (A) NEGATIVE mg/dL   Protein, ur 161100 (A) NEGATIVE mg/dL   Urobilinogen, UA 0.2 0.0 - 1.0 mg/dL   Nitrite NEGATIVE NEGATIVE   Leukocytes, UA NEGATIVE NEGATIVE  Urine microscopic-add on     Status: Abnormal   Collection Time: 01/11/15 11:13 PM  Result Value Ref Range   Squamous Epithelial / LPF RARE RARE   WBC, UA 3-6 <3 WBC/hpf   RBC / HPF 7-10 <3 RBC/hpf   Bacteria, UA FEW (A) RARE   Urine-Other MUCOUS  PRESENT   Wet prep, genital     Status: Abnormal   Collection Time: 01/11/15 11:30 PM  Result Value Ref Range   Yeast Wet Prep HPF POC FEW (A) NONE SEEN   Trich, Wet Prep NONE SEEN NONE SEEN   Clue Cells Wet Prep HPF POC FEW (A) NONE SEEN   WBC, Wet Prep HPF POC MODERATE (A) NONE SEEN  Pregnancy, urine POC     Status: None   Collection Time: 01/11/15 11:36 PM  Result Value Ref Range   Preg Test, Ur NEGATIVE NEGATIVE     MAU Course  Procedures  MDM Patient declines STI testing today.   Assessment and Plan   1. Yeast infection involving the vagina and surrounding area    DC home Rex: Terazol 7, with one refill Instructions on ways to prevent future yeast infections given Return to MAU as needed  Follow-up Information    [facility] [place]   Why:  If symptoms worsen   Contact information:   [address] [facility] [place] [id number] [phone]       Tawnya CrookHogan, Heather Donovan 01/12/2015, 12:07 AM

## 2015-01-12 NOTE — Discharge Instructions (Signed)
1. Use antifungal powder to groin 2-3 times per day to keep area dry 2. Over the counter probiotic supplement. Available in the aisle the laxatives. It should say acidophilus or living cultures. Take according to the directions on the package. 3. Eating yogurt with live active cultures may help too. 4. One refill available on the yeast infection cream, so you can use it again if needed    Candidal Vulvovaginitis Candidal vulvovaginitis is an infection of the vagina and vulva. The vulva is the skin around the opening of the vagina. This may cause itching and discomfort in and around the vagina.  HOME CARE  Only take medicine as told by your doctor.  Do not have sex (intercourse) until the infection is healed or as told by your doctor.  Practice safe sex.  Tell your sex partner about your infection.  Do not douche or use tampons.  Wear cotton underwear. Do not wear tight pants or panty hose.  Eat yogurt. This may help treat and prevent yeast infections. GET HELP RIGHT AWAY IF:   You have a fever.  Your problems get worse during treatment or do not get better in 3 days.  You have discomfort, irritation, or itching in your vagina or vulva area.  You have pain after sex.  You start to get belly (abdominal) pain. MAKE SURE YOU:  Understand these instructions.  Will watch your condition.  Will get help right away if you are not doing well or get worse. Document Released: 11/24/2008 Document Revised: 09/02/2013 Document Reviewed: 11/24/2008 [facility] Patient Information 2015 [place], [place]. This information is not intended to replace advice given to you by your health care provider. Make sure you discuss any questions you have with your health care provider.

## 2015-02-19 ENCOUNTER — Encounter (HOSPITAL_COMMUNITY): Payer: Self-pay | Admitting: *Deleted

## 2015-02-19 ENCOUNTER — Inpatient Hospital Stay (HOSPITAL_COMMUNITY)
Admission: AD | Admit: 2015-02-19 | Discharge: 2015-02-20 | Disposition: A | Discharge status: Home / Self Care | Payer: Self-pay | Source: Ambulatory Visit | Attending: Obstetrics and Gynecology | Admitting: Obstetrics and Gynecology

## 2015-02-19 DIAGNOSIS — B3731 Acute candidiasis of vulva and vagina: Secondary | ICD-10-CM

## 2015-02-19 DIAGNOSIS — B373 Candidiasis of vulva and vagina: Secondary | ICD-10-CM

## 2015-02-19 DIAGNOSIS — N939 Abnormal uterine and vaginal bleeding, unspecified: Secondary | ICD-10-CM

## 2015-02-19 LAB — URINE MICROSCOPIC-ADD ON

## 2015-02-19 LAB — URINALYSIS, ROUTINE W REFLEX MICROSCOPIC
Bilirubin Urine: NEGATIVE
GLUCOSE, UA: NEGATIVE mg/dL
Ketones, ur: NEGATIVE mg/dL
Nitrite: NEGATIVE
PH: 6 (ref 5.0–8.0)
Protein, ur: NEGATIVE mg/dL
SPECIFIC GRAVITY, URINE: 1.02 (ref 1.005–1.030)
UROBILINOGEN UA: 0.2 mg/dL (ref 0.0–1.0)

## 2015-02-19 LAB — WET PREP, GENITAL
Clue Cells Wet Prep HPF POC: NONE SEEN
Trich, Wet Prep: NONE SEEN

## 2015-02-19 LAB — POCT PREGNANCY, URINE: Preg Test, Ur: NEGATIVE

## 2015-02-19 MED ORDER — FLUCONAZOLE 150 MG PO TABS: 150.0000 mg | ORAL_TABLET | Freq: Every day | ORAL | Status: DC

## 2015-02-19 MED ORDER — TRIAMCINOLONE ACETONIDE 0.1 % EX CREA
1.0000 | TOPICAL_CREAM | Freq: Two times a day (BID) | CUTANEOUS | Status: DC
Start: 2015-02-19 — End: 2016-03-06

## 2015-02-19 MED ORDER — FLUCONAZOLE 150 MG PO TABS
150.0000 mg | ORAL_TABLET | Freq: Every day | ORAL | Status: DC
  Administered 2015-02-19: 150 mg via ORAL
  Filled 2015-02-19: qty 1

## 2015-02-19 MED ORDER — NYSTATIN 100000 UNIT/GM EX CREA
TOPICAL_CREAM | CUTANEOUS | Status: DC
Start: 2015-02-19 — End: 2016-03-06

## 2015-02-19 NOTE — Discharge Instructions (Signed)
Candida Infection A Candida infection (also called yeast, fungus, and Monilia infection) is an overgrowth of yeast that can occur anywhere on the body. A yeast infection commonly occurs in warm, moist body areas. Usually, the infection remains localized but can spread to become a systemic infection. A yeast infection may be a sign of a more severe disease such as diabetes, leukemia, or AIDS. A yeast infection can occur in both men and women. In women, Candida vaginitis is a vaginal infection. It is one of the most common causes of vaginitis. Men usually do not have symptoms or know they have an infection until other problems develop. Men may find out they have a yeast infection because their sex partner has a yeast infection. Uncircumcised men are more likely to get a yeast infection than circumcised men. This is because the uncircumcised glans is not exposed to air and does not remain as dry as that of a circumcised glans. Older adults may develop yeast infections around dentures. CAUSES  Women  Antibiotics.  Steroid medication taken for a long time.  Being overweight (obese).  Diabetes.  Poor immune condition.  Certain serious medical conditions.  Immune suppressive medications for organ transplant patients.  Chemotherapy.  Pregnancy.  Menstruation.  Stress and fatigue.  Intravenous drug use.  Oral contraceptives.  Wearing tight-fitting clothes in the crotch area.  Catching it from a sex partner who has a yeast infection.  Spermicide.  Intravenous, urinary, or other catheters. Men  Catching it from a sex partner who has a yeast infection.  Having oral or anal sex with a person who has the infection.  Spermicide.  Diabetes.  Antibiotics.  Poor immune system.  Medications that suppress the immune system.  Intravenous drug use.  Intravenous, urinary, or other catheters. SYMPTOMS  Women  Thick, white vaginal discharge.  Vaginal itching.  Redness and  swelling in and around the vagina.  Irritation of the lips of the vagina and perineum.  Blisters on the vaginal lips and perineum.  Painful sexual intercourse.  Low blood sugar (hypoglycemia).  Painful urination.  Bladder infections.  Intestinal problems such as constipation, indigestion, bad breath, bloating, increase in gas, diarrhea, or loose stools. Men  Men may develop intestinal problems such as constipation, indigestion, bad breath, bloating, increase in gas, diarrhea, or loose stools.  Dry, cracked skin on the penis with itching or discomfort.  Jock itch.  Dry, flaky skin.  Athlete's foot.  Hypoglycemia. DIAGNOSIS  Women  A history and an exam are performed.  The discharge may be examined under a microscope.  A culture may be taken of the discharge. Men  A history and an exam are performed.  Any discharge from the penis or areas of cracked skin will be looked at under the microscope and cultured.  Stool samples may be cultured. TREATMENT  Women  Vaginal antifungal suppositories and creams.  Medicated creams to decrease irritation and itching on the outside of the vagina.  Warm compresses to the perineal area to decrease swelling and discomfort.  Oral antifungal medications.  Medicated vaginal suppositories or cream for repeated or recurrent infections.  Wash and dry the irritation areas before applying the cream.  Eating yogurt with Lactobacillus may help with prevention and treatment.  Sometimes painting the vagina with gentian violet solution may help if creams and suppositories do not work. Men  Antifungal creams and oral antifungal medications.  Sometimes treatment must continue for 30 days after the symptoms go away to prevent recurrence. HOME CARE INSTRUCTIONS  Women  Use cotton underwear and avoid tight-fitting clothing.  Avoid colored, scented toilet paper and deodorant tampons or pads.  Do not douche.  Keep your diabetes  under control.  Finish all the prescribed medications.  Keep your skin clean and dry.  Consume milk or yogurt with Lactobacillus-active culture regularly. If you get frequent yeast infections and think that is what the infection is, there are over-the-counter medications that you can get. If the infection does not show healing in 3 days, talk to your caregiver.  Tell your sex partner you have a yeast infection. Your partner may need treatment also, especially if your infection does not clear up or recurs. Men  Keep your skin clean and dry.  Keep your diabetes under control.  Finish all prescribed medications.  Tell your sex partner that you have a yeast infection so he or she can be treated if necessary. SEEK MEDICAL CARE IF:   Your symptoms do not clear up or worsen in one week after treatment.  You have an oral temperature above 102 F (38.9 C).  You have trouble swallowing or eating for a prolonged time.  You develop blisters on and around your vagina.  You develop vaginal bleeding and it is not your menstrual period.  You develop abdominal pain.  You develop intestinal problems as mentioned above.  You get weak or light-headed.  You have painful or increased urination.  You have pain during sexual intercourse. MAKE SURE YOU:   Understand these instructions.  Will watch your condition.  Will get help right away if you are not doing well or get worse. Document Released: 10/05/2004 Document Revised: 01/12/2014 Document Reviewed: 01/17/2010 [facility] Patient Information 2015 [place], [place]. This information is not intended to replace advice given to you by your health care provider. Make sure you discuss any questions you have with your health care provider.  Abnormal Uterine Bleeding Abnormal uterine bleeding means bleeding from the vagina that is not your normal menstrual period. This can be:  Bleeding or spotting between periods.  Bleeding after sex (sexual  intercourse).  Bleeding that is heavier or more than normal.  Periods that last longer than usual.  Bleeding after menopause. There are many problems that may cause this. Treatment will depend on the cause of the bleeding. Any kind of bleeding that is not normal should be reviewed by your doctor.  HOME CARE Watch your condition for any changes. These actions may lessen any discomfort you are having:  Do not use tampons or douches as told by your doctor.  Change your pads often. You should get regular pelvic exams and Pap tests. Keep all appointments for tests as told by your doctor. GET HELP IF:  You are bleeding for more than 1 week.  You feel dizzy at times. GET HELP RIGHT AWAY IF:   You pass out.  You have to change pads every 15 to 30 minutes.  You have belly pain.  You have a fever.  You become sweaty or weak.  You are passing large blood clots from the vagina.  You feel sick to your stomach (nauseous) and throw up (vomit). MAKE SURE YOU:  Understand these instructions.  Will watch your condition.  Will get help right away if you are not doing well or get worse. Document Released: 06/25/2009 Document Revised: 09/02/2013 Document Reviewed: 03/27/2013 [facility] Patient Information 2015 [place], [place]. This information is not intended to replace advice given to you by your health care provider. Make sure you discuss  any questions you have with your health care provider. ° °

## 2015-02-19 NOTE — MAU Provider Note (Signed)
History     CSN: [id number]  Arrival date and time: 02/19/15 2213   First Provider Initiated Contact with Patient 02/19/15 2255      Chief Complaint  Patient presents with  . Vaginitis   HPI   Annette Daniel is a 19 y.o. female presenting with complaints of  white, clumpy discharge and vaginal irritation. The symptoms have been present for >1 week. She has not tried anything over the counter. She has had frequent yeast infections; several of them since January. No new sexual partner. No history of STI's.    OB History    Gravida Para Term Preterm AB TAB SAB Ectopic Multiple Living        Past Medical History  Diagnosis Date  . Vaginal yeast infection     History reviewed. No pertinent past surgical history.  Family History  Problem Relation Age of Onset  . Multiple sclerosis Father     History  Substance Use Topics  . Smoking status: Never Smoker   . Smokeless tobacco: Never Used  . Alcohol Use: No    Allergies:  Allergies  Allergen Reactions  . Aleve [Naproxen Sodium] Swelling  . Aspirin Swelling    Prescriptions prior to admission  Medication Sig Dispense Refill Last Dose  . HYDROcodone-acetaminophen (NORCO/VICODIN) 5-325 MG per tablet Take 1 tablet by mouth every 6 (six) hours as needed for moderate pain or severe pain. 12 tablet 0 More than a month at Unknown time  . ibuprofen (ADVIL,MOTRIN) 200 MG tablet Take 400 mg by mouth every 6 (six) hours as needed for moderate pain (pt states can take ibuprofen without any problems).    More than a month at Unknown time  . terconazole (TERAZOL 7) 0.4 % vaginal cream Place 1 applicator vaginally at bedtime. 45 g 1    Results for orders placed or performed during the hospital encounter of 02/19/15 (from the past 48 hour(s))  Urinalysis, Routine w reflex microscopic (not at [facility])     Status: Abnormal   Collection Time: 02/19/15 10:53 PM  Result Value Ref Range   Color, Urine YELLOW  YELLOW   APPearance CLEAR CLEAR   Specific Gravity, Urine 1.020 1.005 - 1.030   pH 6.0 5.0 - 8.0   Glucose, UA NEGATIVE NEGATIVE mg/dL   Hgb urine dipstick LARGE (A) NEGATIVE   Bilirubin Urine NEGATIVE NEGATIVE   Ketones, ur NEGATIVE NEGATIVE mg/dL   Protein, ur NEGATIVE NEGATIVE mg/dL   Urobilinogen, UA 0.2 0.0 - 1.0 mg/dL   Nitrite NEGATIVE NEGATIVE   Leukocytes, UA SMALL (A) NEGATIVE  Urine microscopic-add on     Status: Abnormal   Collection Time: 02/19/15 10:53 PM  Result Value Ref Range   Squamous Epithelial / LPF RARE RARE   WBC, UA 3-6 <3 WBC/hpf   RBC / HPF 7-10 <3 RBC/hpf   Bacteria, UA FEW (A) RARE  Pregnancy, urine POC     Status: None   Collection Time: 02/19/15 10:59 PM  Result Value Ref Range   Preg Test, Ur NEGATIVE NEGATIVE    Comment:        THE SENSITIVITY OF THIS METHODOLOGY IS >24 mIU/mL   Wet prep, genital     Status: Abnormal   Collection Time: 02/19/15 11:10 PM  Result Value Ref Range   Yeast Wet Prep HPF POC FEW (A) NONE SEEN   Trich, Wet Prep NONE SEEN NONE SEEN   Clue Cells Wet Prep  HPF POC NONE SEEN NONE SEEN   WBC, Wet Prep HPF POC FEW (A) NONE SEEN    Comment: MODERATE BACTERIA SEEN     Review of Systems  Constitutional: Negative for fever.  Gastrointestinal: Negative for abdominal pain.  Genitourinary: Negative for dysuria.   Physical Exam   Blood pressure 112/53, pulse 75, temperature 98.2 F (36.8 C), resp. rate 20, height 5' 1.5" (1.562 m), weight 53.978 kg (119 lb), last menstrual period 01/18/2015.  Physical Exam  Constitutional: She is oriented to person, place, and time. She appears well-developed and well-nourished. No distress.  HENT:  Head: Normocephalic.  Eyes: Pupils are equal, round, and reactive to light.  Neck: Neck supple.  Respiratory: Effort normal.  Genitourinary: Vaginal discharge found.  Speculum exam: Vagina - Small amount dark red blood with clumps of thick white discharge.  Cervix - No contact  bleeding Bimanual exam: Cervix closed Uterus non tender, normal size Adnexa non tender, no masses bilaterally GC/Chlam, wet prep done Chaperone present for exam.  Musculoskeletal: Normal range of motion.  Neurological: She is alert and oriented to person, place, and time.  Skin: Skin is warm. She is not diaphoretic.  Psychiatric: Her behavior is normal.    MAU Course  Procedures  MDM  Diflucan 150 mg in MAU Urine culture pending   Assessment and Plan    A:  1. Yeast vaginitis   2. Abnormal vaginal bleeding     P:  Discharge home in stable condition RX: diflucan to take in 3 days        Kenalog cream        Nystatin cream Return to MAU for emergencies Follow up with PCP or HD as needed Condoms always    Duane Lope, NP 02/20/2015 6:05 AM

## 2015-02-19 NOTE — Progress Notes (Signed)
Jennifer Rasch NP in to discuss test results and d/c plan. Written and verbal d/c instructions given and understanding voiced °

## 2015-02-19 NOTE — MAU Note (Signed)
I have been spotting for awhile. I have yeast infection. Seen here in early May and treated with cream for yeast infection. Continue to spot off and on and still have yeast

## 2015-02-22 LAB — GC/CHLAMYDIA PROBE AMP (~~LOC~~) NOT AT ARMC
CHLAMYDIA, DNA PROBE: NEGATIVE
NEISSERIA GONORRHEA: NEGATIVE

## 2015-02-25 LAB — URINE CULTURE
Colony Count: NO GROWTH
Culture: NO GROWTH

## 2015-04-17 ENCOUNTER — Encounter (HOSPITAL_COMMUNITY): Payer: Self-pay | Admitting: Family Medicine

## 2015-04-17 ENCOUNTER — Emergency Department (HOSPITAL_COMMUNITY)
Admission: EM | Admit: 2015-04-17 | Discharge: 2015-04-17 | Disposition: A | Discharge status: Home / Self Care | Payer: Self-pay | Attending: Emergency Medicine | Admitting: Emergency Medicine

## 2015-04-17 DIAGNOSIS — N898 Other specified noninflammatory disorders of vagina: Secondary | ICD-10-CM

## 2015-04-17 DIAGNOSIS — Z8619 Personal history of other infectious and parasitic diseases: Secondary | ICD-10-CM | POA: Insufficient documentation

## 2015-04-17 DIAGNOSIS — Z3202 Encounter for pregnancy test, result negative: Secondary | ICD-10-CM | POA: Insufficient documentation

## 2015-04-17 DIAGNOSIS — Z79899 Other long term (current) drug therapy: Secondary | ICD-10-CM | POA: Insufficient documentation

## 2015-04-17 DIAGNOSIS — L298 Other pruritus: Secondary | ICD-10-CM | POA: Insufficient documentation

## 2015-04-17 LAB — URINALYSIS, ROUTINE W REFLEX MICROSCOPIC
Bilirubin Urine: NEGATIVE
Glucose, UA: NEGATIVE mg/dL
KETONES UR: NEGATIVE mg/dL
Leukocytes, UA: NEGATIVE
Nitrite: NEGATIVE
PH: 6.5 (ref 5.0–8.0)
Protein, ur: 100 mg/dL — AB
SPECIFIC GRAVITY, URINE: 1.021 (ref 1.005–1.030)
Urobilinogen, UA: 1 mg/dL (ref 0.0–1.0)

## 2015-04-17 LAB — URINE MICROSCOPIC-ADD ON

## 2015-04-17 LAB — WET PREP, GENITAL
CLUE CELLS WET PREP: NONE SEEN
TRICH WET PREP: NONE SEEN
WBC WET PREP: NONE SEEN
YEAST WET PREP: NONE SEEN

## 2015-04-17 LAB — PREGNANCY, URINE: Preg Test, Ur: NEGATIVE

## 2015-04-17 LAB — CBG MONITORING, ED: GLUCOSE-CAPILLARY: 88 mg/dL (ref 65–99)

## 2015-04-17 NOTE — ED Provider Notes (Signed)
CSN: [id number]     Arrival date & time 04/17/15  1344 History   First MD Initiated Contact with Patient 04/17/15 1434     Chief Complaint  Patient presents with  . Vaginal Itching     (Consider location/radiation/quality/duration/timing/severity/associated sxs/prior Treatment) HPI Comments: Pt comes in with c/o vaginal itching and discharge that has been going on intermittently for 1 year. She states that she has been using all different medication including diflucan and tercanazole inserts at the end of the last month. She states that the symptoms get better but then return . No fever, dysuria of abdominal pain  The history is provided by the patient. No language interpreter was used.    Past Medical History  Diagnosis Date  . Vaginal yeast infection    History reviewed. No pertinent past surgical history. Family History  Problem Relation Age of Onset  . Multiple sclerosis Father    History  Substance Use Topics  . Smoking status: Never Smoker   . Smokeless tobacco: Never Used  . Alcohol Use: No   OB History    Gravida Para Term Preterm AB TAB SAB Ectopic Multiple Living       Review of Systems  All other systems reviewed and are negative.     Allergies  Aleve and Aspirin  Home Medications   Prior to Admission medications   Medication Sig Start Date End Date Taking? Authorizing Provider  fluconazole (DIFLUCAN) 150 MG tablet Take 1 tablet (150 mg total) by mouth daily. 02/19/15   Duane Lope, NP  HYDROcodone-acetaminophen (NORCO/VICODIN) 5-325 MG per tablet Take 1 tablet by mouth every 6 (six) hours as needed for moderate pain or severe pain. 04/02/14   Fayrene Helper, PA-C  ibuprofen (ADVIL,MOTRIN) 200 MG tablet Take 400 mg by mouth every 6 (six) hours as needed for moderate pain (pt states can take ibuprofen without any problems).     Historical Provider, MD  nystatin cream (MYCOSTATIN) Apply to affected area 2 times daily 02/19/15   Duane Lope, NP  triamcinolone cream (KENALOG) 0.1 % Apply 1 application topically 2 (two) times daily. 02/19/15   Harolyn Rutherford Rasch, NP   BP 122/77 mmHg  Pulse 97  Temp(Src) 98 F (36.7 C) (Oral)  Resp 18  SpO2 100% Physical Exam  Constitutional: She is oriented to person, place, and time. She appears well-developed and well-nourished.  HENT:  Head: Normocephalic and atraumatic.  Cardiovascular: Normal rate and regular rhythm.   Pulmonary/Chest: Effort normal and breath sounds normal.  Abdominal: Soft. Bowel sounds are normal. There is no tenderness.  Genitourinary:  Vaginal blood not in vault. No adnexal tenderness  Neurological: She is alert and oriented to person, place, and time.  Skin: Skin is warm and dry.  Psychiatric: She has a normal mood and affect.  Nursing note and vitals reviewed.   ED Course  Procedures (including critical care time) Labs Review Labs Reviewed  URINALYSIS, ROUTINE W REFLEX MICROSCOPIC (NOT AT [facility]) - Abnormal; Notable for the following:    Hgb urine dipstick LARGE (*)    Protein, ur 100 (*)    All other components within normal limits  URINE MICROSCOPIC-ADD ON - Abnormal; Notable for the following:    Squamous Epithelial / LPF FEW (*)    All other components within normal limits  WET PREP, GENITAL  PREGNANCY, URINE  HIV ANTIBODY (ROUTINE TESTING)  CBG MONITORING, ED  GC/CHLAMYDIA PROBE AMP ([facility]) NOT AT [facility]    Imaging Review No results found.   EKG Interpretation None      MDM   Final diagnoses:  Vaginal itching    No yeast noted on wet prep. Discussed follow up with pt. Pt is not pregnant    Teressa Lower, NP 04/17/15 1630  Gilda Crease, MD 04/17/15 [phone]

## 2015-04-17 NOTE — ED Notes (Signed)
Pt here for continued vaginal pain and itching after multiple treatments from here and the health department.

## 2015-04-17 NOTE — Discharge Instructions (Signed)
Pruritus  °Pruritus is an itch. There are many different problems that can cause an itch. Dry skin is one of the most common causes of itching. Most cases of itching do not require medical attention.  °HOME CARE INSTRUCTIONS  °Make sure your skin is moistened on a regular basis. A moisturizer that contains petroleum jelly is best for keeping moisture in your skin. If you develop a rash, you may try the following for relief:  °· Use corticosteroid cream. °· Apply cool compresses to the affected areas. °· Bathe with Epsom salts or baking soda in the bathwater. °· Soak in colloidal oatmeal baths. These are available at your pharmacy. °· Apply baking soda paste to the rash. Stir water into baking soda until it reaches a paste-like consistency. °· Use an anti-itch lotion. °· Take over-the-counter diphenhydramine medicine by mouth as the instructions direct. °· Avoid scratching. Scratching may cause the rash to become infected. If itching is very bad, your caregiver may suggest prescription lotions or creams to lessen your symptoms. °· Avoid hot showers, which can make itching worse. A cold shower may help with itching as long as you use a moisturizer after the shower. °SEEK MEDICAL CARE IF: °The itching does not go away after several days. °Document Released: 05/10/2011 Document Revised: 01/12/2014 Document Reviewed: 05/10/2011 °ExitCare® Patient Information ©2015 [employer]. This information is not intended to replace advice given to you by your health care provider. Make sure you discuss any questions you have with your health care provider. ° °

## 2015-04-17 NOTE — ED Notes (Signed)
Pt alert x4 respirations easy non labored.  

## 2015-04-18 LAB — HIV ANTIBODY (ROUTINE TESTING W REFLEX): HIV Screen 4th Generation wRfx: NONREACTIVE

## 2015-04-19 LAB — GC/CHLAMYDIA PROBE AMP (~~LOC~~) NOT AT ARMC
Chlamydia: NEGATIVE
NEISSERIA GONORRHEA: NEGATIVE

## 2015-05-20 ENCOUNTER — Ambulatory Visit: Payer: Self-pay | Attending: Internal Medicine | Admitting: Internal Medicine

## 2015-05-20 ENCOUNTER — Encounter: Payer: Self-pay | Admitting: Internal Medicine

## 2015-05-20 VITALS — BP 112/75 | HR 72 | Temp 97.6°F | Resp 16 | Ht 62.0 in | Wt 110.6 lb

## 2015-05-20 DIAGNOSIS — N760 Acute vaginitis: Secondary | ICD-10-CM | POA: Insufficient documentation

## 2015-05-20 DIAGNOSIS — N898 Other specified noninflammatory disorders of vagina: Secondary | ICD-10-CM | POA: Insufficient documentation

## 2015-05-20 LAB — POCT URINALYSIS DIPSTICK
BILIRUBIN UA: NEGATIVE
GLUCOSE UA: NEGATIVE
Ketones, UA: NEGATIVE
Leukocytes, UA: NEGATIVE
Nitrite, UA: NEGATIVE
Protein, UA: 30
RBC UA: NEGATIVE
SPEC GRAV UA: 1.02
UROBILINOGEN UA: 1
pH, UA: 7

## 2015-05-20 LAB — POCT GLYCOSYLATED HEMOGLOBIN (HGB A1C): HEMOGLOBIN A1C: 5.7

## 2015-05-20 NOTE — Patient Instructions (Signed)
Get OTC Probiotics for vaginal/digestive health

## 2015-05-20 NOTE — Progress Notes (Signed)
Patient ID: Annette Daniel, female   DOB: 12/02/1995, 19 y.o.   MRN: [id number]  CC: vaginal discharge   HPI: Annette Daniel is a 19 y.o. female here today for a follow up visit.  Patient has past medical history of recurrent vaginitis. Patient reports that she has had a recurrent vaginal infection since end of last year. She notes that she has had chlamydia in the past but that was cleared and she uses safe sex now. She now reports that she has been having vaginal swelling and white discharge. She only uses Dove sensitive skin soap. Patient denies vaginal itch, odor, or lesions.   Allergies  Allergen Reactions  . Aleve [Naproxen Sodium] Swelling  . Aspirin Swelling   Past Medical History  Diagnosis Date  . Vaginal yeast infection    Current Outpatient Prescriptions on File Prior to Visit  Medication Sig Dispense Refill  . nystatin cream (MYCOSTATIN) Apply to affected area 2 times daily 30 g 1  . triamcinolone cream (KENALOG) 0.1 % Apply 1 application topically 2 (two) times daily. 30 g 0  . fluconazole (DIFLUCAN) 150 MG tablet Take 1 tablet (150 mg total) by mouth daily. 1 tablet 0  . HYDROcodone-acetaminophen (NORCO/VICODIN) 5-325 MG per tablet Take 1 tablet by mouth every 6 (six) hours as needed for moderate pain or severe pain. 12 tablet 0  . ibuprofen (ADVIL,MOTRIN) 200 MG tablet Take 400 mg by mouth every 6 (six) hours as needed for moderate pain (pt states can take ibuprofen without any problems).      No current facility-administered medications on file prior to visit.   Family History  Problem Relation Age of Onset  . Multiple sclerosis Father    Social History   Social History  . Marital Status: Single    Spouse Name: N/A  . Number of Children: N/A  . Years of Education: N/A   Occupational History  . Not on file.   Social History Main Topics  . Smoking status: Light Tobacco Smoker  . Smokeless tobacco: Never Used     Comment: black and mild  . Alcohol Use:  0.0 oz/week    0 Standard drinks or equivalent per week  . Drug Use: No  . Sexual Activity: Yes    Birth Control/ Protection: None   Other Topics Concern  . Not on file   Social History Narrative    Review of Systems: Other than what is stated in HPI, all other systems are negative.   Objective:   Filed Vitals:   05/20/15 1429  BP: 112/75  Pulse: 72  Temp: 97.6 F (36.4 C)  Resp: 16    Physical Exam  Cardiovascular: Normal rate, regular rhythm and normal heart sounds.   Pulmonary/Chest: Effort normal and breath sounds normal.  Abdominal: Soft. Bowel sounds are normal. She exhibits no distension. There is no tenderness.  Genitourinary: Uterus normal. Cervix exhibits no motion tenderness, no discharge and no friability. Right adnexum displays no tenderness. Left adnexum displays no tenderness. Vaginal discharge found.  Lymphadenopathy:       Right: No inguinal adenopathy present.       Left: No inguinal adenopathy present.     Lab Results  Component Value Date   WBC 6.4 02/06/2014   HGB 10.6* 02/06/2014   HCT 32.6* 02/06/2014   MCV 85.6 02/06/2014   PLT 191 02/06/2014   Lab Results  Component Value Date   CREATININE 0.72 02/06/2014   BUN 10 02/06/2014   NA 138 02/06/2014  K 3.7 02/06/2014   CL 102 02/06/2014   CO2 25 02/06/2014    No results found for: HGBA1C Lipid Panel  No results found for: CHOL, TRIG, HDL, CHOLHDL, VLDL, LDLCALC     Assessment and plan:   Annette Daniel was seen today for new patient (initial visit).  Diagnoses and all orders for this visit:  Vaginal discharge -     Urinalysis Dipstick -     Cervicovaginal ancillary only Will call patient with results.   Recurrent vaginitis -     POCT glycosylated hemoglobin (Hb A1C)---5.7%   Return if symptoms worsen or fail to improve.        Annette Finland, NP-C [facility] [phone] 05/20/2015, 2:43 PM

## 2015-05-20 NOTE — Progress Notes (Signed)
Patient here to establish care Patient states she has a history of yeast infections and Wants to make sure her latest has cleared up Patient was last treated 8/6 in the ER Patient requesting to have a pelvic exam Patient declined HIV testing

## 2015-05-21 LAB — CERVICOVAGINAL ANCILLARY ONLY
CHLAMYDIA, DNA PROBE: POSITIVE — AB
Neisseria Gonorrhea: NEGATIVE

## 2015-05-24 LAB — CERVICOVAGINAL ANCILLARY ONLY: Wet Prep (BD Affirm): POSITIVE — AB

## 2015-05-25 ENCOUNTER — Other Ambulatory Visit: Payer: Self-pay | Admitting: Internal Medicine

## 2015-05-25 ENCOUNTER — Telehealth: Payer: Self-pay

## 2015-05-25 DIAGNOSIS — B9689 Other specified bacterial agents as the cause of diseases classified elsewhere: Secondary | ICD-10-CM

## 2015-05-25 DIAGNOSIS — A749 Chlamydial infection, unspecified: Secondary | ICD-10-CM

## 2015-05-25 DIAGNOSIS — N760 Acute vaginitis: Principal | ICD-10-CM

## 2015-05-25 MED ORDER — METRONIDAZOLE 500 MG PO TABS: 500.0000 mg | ORAL_TABLET | Freq: Two times a day (BID) | ORAL | Status: DC

## 2015-05-25 MED ORDER — AZITHROMYCIN 250 MG PO TABS: ORAL_TABLET | ORAL | Status: DC

## 2015-05-25 NOTE — Telephone Encounter (Signed)
Spoke with patient this am  Patient is aware she has an STD as well as BV Patient is aware to pick up her medications at [facility] pharmacy Health department form filled out and faxed

## 2016-02-27 ENCOUNTER — Encounter (HOSPITAL_COMMUNITY): Payer: Self-pay

## 2016-02-27 ENCOUNTER — Emergency Department (HOSPITAL_COMMUNITY)
Admission: EM | Admit: 2016-02-27 | Discharge: 2016-02-27 | Disposition: A | Discharge status: Home / Self Care | Payer: No Typology Code available for payment source | Attending: Emergency Medicine | Admitting: Emergency Medicine

## 2016-02-27 DIAGNOSIS — F17200 Nicotine dependence, unspecified, uncomplicated: Secondary | ICD-10-CM | POA: Insufficient documentation

## 2016-02-27 DIAGNOSIS — N926 Irregular menstruation, unspecified: Secondary | ICD-10-CM

## 2016-02-27 DIAGNOSIS — Z79899 Other long term (current) drug therapy: Secondary | ICD-10-CM | POA: Insufficient documentation

## 2016-02-27 LAB — URINALYSIS, ROUTINE W REFLEX MICROSCOPIC
BILIRUBIN URINE: NEGATIVE
Glucose, UA: NEGATIVE mg/dL
KETONES UR: NEGATIVE mg/dL
Leukocytes, UA: NEGATIVE
Nitrite: NEGATIVE
PH: 5.5 (ref 5.0–8.0)
PROTEIN: 30 mg/dL — AB
Specific Gravity, Urine: 1.022 (ref 1.005–1.030)

## 2016-02-27 LAB — I-STAT CHEM 8, ED
BUN: 8 mg/dL (ref 6–20)
Calcium, Ion: 1.25 mmol/L — ABNORMAL HIGH (ref 1.12–1.23)
Chloride: 103 mmol/L (ref 101–111)
Creatinine, Ser: 0.8 mg/dL (ref 0.44–1.00)
Glucose, Bld: 79 mg/dL (ref 65–99)
HCT: 32 % — ABNORMAL LOW (ref 36.0–46.0)
HEMOGLOBIN: 10.9 g/dL — AB (ref 12.0–15.0)
Potassium: 3.9 mmol/L (ref 3.5–5.1)
SODIUM: 141 mmol/L (ref 135–145)
TCO2: 26 mmol/L (ref 0–100)

## 2016-02-27 LAB — WET PREP, GENITAL
Sperm: NONE SEEN
TRICH WET PREP: NONE SEEN
Yeast Wet Prep HPF POC: NONE SEEN

## 2016-02-27 LAB — URINE MICROSCOPIC-ADD ON

## 2016-02-27 LAB — POC URINE PREG, ED: Preg Test, Ur: NEGATIVE

## 2016-02-27 NOTE — ED Notes (Signed)
Pt ambulates independently and with steady gait at time of discharge. Discharge instructions and follow up information reviewed with patient. No other questions or concerns voiced at this time.  

## 2016-02-27 NOTE — Discharge Instructions (Signed)

## 2016-02-27 NOTE — ED Notes (Signed)
Patient here with irregular period x 3 weeks. Took home pregnancy test that was negative. No pain

## 2016-02-27 NOTE — ED Provider Notes (Signed)
CSN: [id number]     Arrival date & time 02/27/16  1035 History   First MD Initiated Contact with Patient 02/27/16 1104     Chief Complaint  Patient presents with  . irregular period     HPI Comments: 20 year old female presents with vaginal spotting for the past 3 weeks. No significant past medical history. She has taken a home pregnancy test which was negative. Her last menstrual period was April 28 which was normal. She states she is not on any form of hormonal birth control at this time. She used to be on the Depo-Provera however she would bleed while she was on it so she stopped this but it has been over a year. She denies fever, chills, abdominal pain, nausea, vomiting, diarrhea, dysuria, vaginal itching, vaginal discharge. She is sexually active and denies pain with intercourse.   Past Medical History  Diagnosis Date  . Vaginal yeast infection    History reviewed. No pertinent past surgical history. Family History  Problem Relation Age of Onset  . Multiple sclerosis Father    Social History  Substance Use Topics  . Smoking status: Light Tobacco Smoker  . Smokeless tobacco: Never Used     Comment: black and mild  . Alcohol Use: 0.0 oz/week    0 Standard drinks or equivalent per week   OB History    Gravida Para Term Preterm AB TAB SAB Ectopic Multiple Living   0 0 0 0 0 0 0 0 0 0      Review of Systems  Gastrointestinal: Negative for nausea, vomiting, abdominal pain, diarrhea and blood in stool.  Genitourinary: Positive for vaginal bleeding and menstrual problem. Negative for dysuria, flank pain, vaginal discharge, vaginal pain, pelvic pain and dyspareunia.    Allergies  Aleve and Aspirin  Home Medications   Prior to Admission medications   Medication Sig Start Date End Date Taking? Authorizing Provider  azithromycin (ZITHROMAX) 250 MG tablet Take 4 pills today all at once 05/25/15   Ambrose FinlandValerie A Keck, NP  fluconazole (DIFLUCAN) 150 MG tablet Take 1 tablet (150 mg total)  by mouth daily. 02/19/15   Duane LopeJennifer I Rasch, NP  HYDROcodone-acetaminophen (NORCO/VICODIN) 5-325 MG per tablet Take 1 tablet by mouth every 6 (six) hours as needed for moderate pain or severe pain. 04/02/14   Fayrene HelperBowie Tran, PA-C  ibuprofen (ADVIL,MOTRIN) 200 MG tablet Take 400 mg by mouth every 6 (six) hours as needed for moderate pain (pt states can take ibuprofen without any problems).     Historical Provider, MD  levonorgestrel-ethinyl estradiol (KURVELO) 0.15-30 MG-MCG tablet Take 1 tablet by mouth daily.    Historical Provider, MD  metroNIDAZOLE (FLAGYL) 500 MG tablet Take 1 tablet (500 mg total) by mouth 2 (two) times daily. 05/25/15   Ambrose FinlandValerie A Keck, NP  nystatin cream (MYCOSTATIN) Apply to affected area 2 times daily 02/19/15   Duane LopeJennifer I Rasch, NP  triamcinolone cream (KENALOG) 0.1 % Apply 1 application topically 2 (two) times daily. 02/19/15   Harolyn RutherfordJennifer I Rasch, NP   BP 110/71 mmHg  Pulse 81  Temp(Src) 98 F (36.7 C) (Oral)  Resp 14  SpO2 100%  LMP 01/07/2016   Physical Exam  Constitutional: She is oriented to person, place, and time. She appears well-developed and well-nourished. No distress.  HENT:  Head: Normocephalic and atraumatic.  Eyes: Conjunctivae are normal. Pupils are equal, round, and reactive to light. Right eye exhibits no discharge. Left eye exhibits no discharge. No scleral icterus.  Neck: Normal range of  motion.  Cardiovascular: Normal rate and regular rhythm.  Exam reveals no gallop and no friction rub.   No murmur heard. Pulmonary/Chest: Effort normal and breath sounds normal. No respiratory distress. She has no wheezes. She has no rales. She exhibits no tenderness.  Abdominal: Soft. Bowel sounds are normal. She exhibits no distension and no mass. There is no tenderness. There is no rebound and no guarding.  Genitourinary:  No inguinal lymphadenopathy or inguinal hernia noted. Normal external genitalia. No pain with speculum insertion. Closed cervical os with normal  appearance - no rash or lesions. No significant discharge. Small amount of old brown blood in vaginal vault. On bimanual examination no adnexal tenderness or cervical motion tenderness. Chaperone present during exam.    Neurological: She is alert and oriented to person, place, and time.  Skin: Skin is warm and dry.  Psychiatric: She has a normal mood and affect. Her behavior is normal.    ED Course  Procedures (including critical care time) Labs Review Labs Reviewed  WET PREP, GENITAL - Abnormal; Notable for the following:    Clue Cells Wet Prep HPF POC PRESENT (*)    WBC, Wet Prep HPF POC MODERATE (*)    All other components within normal limits  URINALYSIS, ROUTINE W REFLEX MICROSCOPIC (NOT AT [facility]) - Abnormal; Notable for the following:    Hgb urine dipstick MODERATE (*)    Protein, ur 30 (*)    All other components within normal limits  URINE MICROSCOPIC-ADD ON - Abnormal; Notable for the following:    Squamous Epithelial / LPF 0-5 (*)    Bacteria, UA RARE (*)    All other components within normal limits  I-STAT CHEM 8, ED - Abnormal; Notable for the following:    Calcium, Ion 1.25 (*)    Hemoglobin 10.9 (*)    HCT 32.0 (*)    All other components within normal limits  POC URINE PREG, ED  GC/CHLAMYDIA PROBE AMP ([place]) NOT AT [facility]    Imaging Review No results found. I have personally reviewed and evaluated these images and lab results as part of my medical decision-making.   EKG Interpretation None      MDM   Final diagnoses:  Irregular periods   20 year old female presents with vaginal spotting for the past 3 weeks. Patient is afebrile, not tachycardic or tachypneic, normotensive, and not hypoxic. She is anemic however seems at baseline compared to 2 years ago. UA is remarkable for moderate amount of hemoglobin. This may be due to contamination - patient denies urinary complaints or abdominal pain. Pelvic exam remarkable for old blood in the vaginal vault.  Discussed results with patient and recommend follow-up with family doctor or OB/GYN. Patient is NAD, non-toxic, with stable VS. Patient is informed of clinical course, understands medical decision making process, and agrees with plan. Opportunity for questions provided and all questions answered. Return precautions given.     Bethel Born, PA-C 02/27/16 1340  Vanetta Mulders, MD 03/01/16 [phone]

## 2016-02-28 LAB — GC/CHLAMYDIA PROBE AMP (~~LOC~~) NOT AT ARMC
Chlamydia: NEGATIVE
Neisseria Gonorrhea: NEGATIVE

## 2016-03-06 ENCOUNTER — Encounter (HOSPITAL_COMMUNITY): Payer: Self-pay

## 2016-03-06 ENCOUNTER — Inpatient Hospital Stay (HOSPITAL_COMMUNITY)
Admission: AD | Admit: 2016-03-06 | Discharge: 2016-03-06 | Disposition: A | Discharge status: Home / Self Care | Payer: No Typology Code available for payment source | Source: Ambulatory Visit | Attending: Obstetrics and Gynecology | Admitting: Obstetrics and Gynecology

## 2016-03-06 DIAGNOSIS — Z87891 Personal history of nicotine dependence: Secondary | ICD-10-CM | POA: Insufficient documentation

## 2016-03-06 DIAGNOSIS — N911 Secondary amenorrhea: Secondary | ICD-10-CM

## 2016-03-06 DIAGNOSIS — N912 Amenorrhea, unspecified: Secondary | ICD-10-CM | POA: Insufficient documentation

## 2016-03-06 HISTORY — DX: Other specified health status: Z78.9

## 2016-03-06 LAB — URINALYSIS, ROUTINE W REFLEX MICROSCOPIC
Bilirubin Urine: NEGATIVE
Glucose, UA: NEGATIVE mg/dL
Ketones, ur: NEGATIVE mg/dL
Leukocytes, UA: NEGATIVE
NITRITE: NEGATIVE
Protein, ur: 100 mg/dL — AB
SPECIFIC GRAVITY, URINE: 1.02 (ref 1.005–1.030)
pH: 5.5 (ref 5.0–8.0)

## 2016-03-06 LAB — URINE MICROSCOPIC-ADD ON

## 2016-03-06 LAB — POCT PREGNANCY, URINE: PREG TEST UR: NEGATIVE

## 2016-03-06 NOTE — MAU Note (Signed)
Pt states she didn't have a period in May but has been spotting since June 10 - dark blood.  Blood appears more bright red blood after voiding on toilet tissue.  Occasional lower abd cramping.  Neg HPT.

## 2016-03-06 NOTE — MAU Provider Note (Signed)
History     CSN: [id number]  Arrival date and time: 03/06/16 1344   First Provider Initiated Contact with Patient 03/06/16 1627      Chief Complaint  Patient presents with  . Vaginal Bleeding   HPI  Annette Daniel is a 20 y.o. G0P0000. LMP 4/28, uses condoms sometimes, UPT's have been negative. She is worried why not having menses, is worried won't be able to get pregnant when wants to.  She started brown spotting 6/9, has had since, sometimes bright red, occ cramping like going to start. No changes in discharge, odor or itching. No urinary frequency, urgency or dysuria. No GI changes. Same partner x 2 yr. She has ED visit 6/18 for spotting, GC/CT was negative, Hgb was 10.9.Last Depo Provera ~ 1 yr ago, started regular menses in Dec, has 36 d cycles instead of 28 d. [facility]  OB History    Gravida Para Term Preterm AB TAB SAB Ectopic Multiple Living   0 0 0 0 0 0 0 0 0 0       Past Medical History  Diagnosis Date  . Vaginal yeast infection   . Medical history non-contributory     Past Surgical History  Procedure Laterality Date  . No past surgeries      Family History  Problem Relation Age of Onset  . Multiple sclerosis Father   . Hypertension Mother     Social History  Substance Use Topics  . Smoking status: Former Smoker    Quit date: 03/04/2016  . Smokeless tobacco: Never Used     Comment: black and mild  . Alcohol Use: 0.0 oz/week    0 Standard drinks or equivalent per week    Allergies:  Allergies  Allergen Reactions  . Aleve [Naproxen Sodium] Swelling  . Aspirin Swelling    Prescriptions prior to admission  Medication Sig Dispense Refill Last Dose  . Misc Natural Products (CRANBERRY/PROBIOTIC PO) Take 1 tablet by mouth daily.   Past Month at Unknown time  . azithromycin (ZITHROMAX) 250 MG tablet Take 4 pills today all at once (Patient not taking: Reported on 02/27/2016) 4 tablet 0 Completed Course at Unknown time  . fluconazole (DIFLUCAN)  150 MG tablet Take 1 tablet (150 mg total) by mouth daily. (Patient not taking: Reported on 02/27/2016) 1 tablet 0 Completed Course at Unknown time  . ibuprofen (ADVIL,MOTRIN) 200 MG tablet Take 400 mg by mouth every 6 (six) hours as needed for moderate pain (pt states can take ibuprofen without any problems). Reported on 03/06/2016   Not Taking at Unknown time  . metroNIDAZOLE (FLAGYL) 500 MG tablet Take 1 tablet (500 mg total) by mouth 2 (two) times daily. (Patient not taking: Reported on 02/27/2016) 14 tablet 0 Completed Course at Unknown time  . nystatin cream (MYCOSTATIN) Apply to affected area 2 times daily (Patient not taking: Reported on 02/27/2016) 30 g 1 Completed Course at Unknown time  . triamcinolone cream (KENALOG) 0.1 % Apply 1 application topically 2 (two) times daily. (Patient not taking: Reported on 02/27/2016) 30 g 0 Completed Course at Unknown time    Review of Systems  Constitutional: Negative for fever and chills.  Gastrointestinal: Positive for abdominal pain (occ cramping). Negative for nausea, vomiting, diarrhea and constipation.  Genitourinary: Negative for dysuria, urgency and frequency.       Brown spotting   Physical Exam   Blood pressure 94/63, pulse 66, temperature 98 F (36.7 C), temperature source Oral, resp. rate 16, height 5'  1" (1.549 m), weight 117 lb (53.071 kg), last menstrual period 01/07/2016.  Physical Exam  Nursing note and vitals reviewed. Constitutional: She is oriented to person, place, and time. She appears well-developed and well-nourished.  Musculoskeletal: Normal range of motion.  Neurological: She is alert and oriented to person, place, and time.  Skin: Skin is warm and dry.  Psychiatric: She has a normal mood and affect. Her behavior is normal.    MAU Course  Procedures  MDM Pt going back to school in the fall, doesn't need to be pregnant at this time. To call HD for an appt to get back on contraception. Use condoms until that  appt.  Assessment and Plan  Amenorrhea, not pregnant Call HD for an appt to start contraception PNV with folic acid if decides to not get on BC Do monthly UPT's  Cj Edgell M. 03/06/2016, 4:49 PM

## 2016-03-06 NOTE — Discharge Instructions (Signed)
Secondary Amenorrhea Secondary amenorrhea is the stopping of menstrual flow for 3-6 months in a female who has previously had periods. There are many possible causes. Most of these causes are not serious. Usually, treating the underlying problem causing the loss of menses will return your periods to normal. CAUSES  Some common and uncommon causes of not menstruating include:  Malnutrition.  Low blood sugar (hypoglycemia).  Polycystic ovary disease.  Stress or fear.  Breastfeeding.  Hormone imbalance.  Ovarian failure.  Medicines.  Extreme obesity.  Cystic fibrosis.  Low body weight or drastic weight reduction from any cause.  Early menopause.  Removal of ovaries or uterus.  Contraceptives.  Illness.  Long-term (chronic) illnesses.  Cushing syndrome.  Thyroid problems.  Birth control pills, patches, or vaginal rings for birth control. RISK FACTORS You may be at greater risk of secondary amenorrhea if:  You have a family history of this condition.  You have an eating disorder.  You do athletic training. DIAGNOSIS  A diagnosis is made by your health care provider taking a medical history and doing a physical exam. This will include a pelvic exam to check for problems with your reproductive organs. Pregnancy must be ruled out. Often, numerous blood tests are done to measure different hormones in the body. Urine testing may be done. Specialized exams (ultrasound, CT scan, MRI, or hysteroscopy) may have to be done as well as measuring the body mass index (BMI). TREATMENT  Treatment depends on the cause of the amenorrhea. If an eating disorder is present, this can be treated with an adequate diet and therapy. Chronic illnesses may improve with treatment of the illness. Amenorrhea may be corrected with medicines, lifestyle changes, or surgery. If the amenorrhea cannot be corrected, it is sometimes possible to create a false menstruation with medicines. HOME CARE  INSTRUCTIONS  Maintain a healthy diet.  Manage weight problems.  Exercise regularly but not excessively.  Get adequate sleep.  Manage stress.  Be aware of changes in your menstrual cycle. Keep a record of when your periods occur. Note the date your period starts, how long it lasts, and any problems. SEEK MEDICAL CARE IF: Your symptoms do not get better with treatment.   This information is not intended to replace advice given to you by your health care provider. Make sure you discuss any questions you have with your health care provider.   Document Released: 10/09/2006 Document Revised: 09/18/2014 Document Reviewed: 02/13/2013 Elsevier Interactive Patient Education 2016 Elsevier Inc.  

## 2016-03-08 ENCOUNTER — Telehealth: Payer: Self-pay | Admitting: Internal Medicine

## 2016-03-08 NOTE — Telephone Encounter (Signed)
Patient called states she did not get a menstral cycle in may and has been spotting since then, please f/up

## 2016-03-15 NOTE — Telephone Encounter (Signed)
Rn advised patient due to the fact that several things can cause symptoms of break through bleeding a office visit would be the best way to diagnose cause.Patient verbalized understanding but stated symptoms have now resolved. Pollyann KennedyKim Becton, RN, BSN

## 2016-03-22 ENCOUNTER — Telehealth: Payer: Self-pay | Admitting: Internal Medicine

## 2016-03-22 NOTE — Telephone Encounter (Signed)
Pt called requesting to speak to nurse regarding her irregular periods pt states she has been bleeding for the past 2 weeks , please f/up

## 2016-04-10 NOTE — Telephone Encounter (Signed)
Patient verified DOB Patient states she is currently still bleeding. Patient went to [facility] ED on 03/06/16. Patient was advised to see an OB/GYN. Patient has not been seen in [facility] office since 9/16 with [facility].  Patient was transferred to the front desk to reestablish care and to see walk in this week in order to obtain a referral. Patient does not currently have insurance. No further questions at this time.

## 2016-04-24 ENCOUNTER — Ambulatory Visit: Payer: Self-pay | Attending: Internal Medicine | Admitting: Physician Assistant

## 2016-04-24 ENCOUNTER — Encounter: Payer: Self-pay | Admitting: Physician Assistant

## 2016-04-24 VITALS — BP 106/72 | HR 78 | Temp 98.1°F | Wt 114.0 lb

## 2016-04-24 DIAGNOSIS — B9689 Other specified bacterial agents as the cause of diseases classified elsewhere: Secondary | ICD-10-CM

## 2016-04-24 DIAGNOSIS — N760 Acute vaginitis: Secondary | ICD-10-CM

## 2016-04-24 DIAGNOSIS — N926 Irregular menstruation, unspecified: Secondary | ICD-10-CM

## 2016-04-24 DIAGNOSIS — A499 Bacterial infection, unspecified: Secondary | ICD-10-CM

## 2016-04-24 LAB — FERRITIN: FERRITIN: 6 ng/mL — AB (ref 10–154)

## 2016-04-24 LAB — CBC WITH DIFFERENTIAL/PLATELET
BASOS PCT: 0 %
Basophils Absolute: 0 cells/uL (ref 0–200)
Eosinophils Absolute: 53 cells/uL (ref 15–500)
Eosinophils Relative: 1 %
HCT: 33.5 % — ABNORMAL LOW (ref 35.0–45.0)
Hemoglobin: 10.7 g/dL — ABNORMAL LOW (ref 11.7–15.5)
Lymphs Abs: 3074 cells/uL (ref 850–3900)
MCH: 27 pg (ref 27.0–33.0)
MCHC: 31.9 g/dL — ABNORMAL LOW (ref 32.0–36.0)
MCV: 84.4 fL (ref 80.0–100.0)
MONO ABS: 371 {cells}/uL (ref 200–950)
MONOS PCT: 7 %
MPV: 11.4 fL (ref 7.5–12.5)
Neutro Abs: 1802 cells/uL (ref 1500–7800)
Neutrophils Relative %: 34 %
PLATELETS: 225 10*3/uL (ref 140–400)
RBC: 3.97 MIL/uL (ref 3.80–5.10)
RDW: 17.8 % — ABNORMAL HIGH (ref 11.0–15.0)
WBC: 5.3 10*3/uL (ref 3.8–10.8)

## 2016-04-24 LAB — IRON AND TIBC
%SAT: 15 % (ref 11–50)
IRON: 67 ug/dL (ref 40–190)
TIBC: 456 ug/dL — AB (ref 250–450)
UIBC: 389 ug/dL (ref 125–400)

## 2016-04-24 LAB — TSH: TSH: 1 m[IU]/L

## 2016-04-24 MED ORDER — FERROUS SULFATE 325 (65 FE) MG PO TABS: 325.0000 mg | ORAL_TABLET | Freq: Every day | ORAL | 1 refills | Status: AC

## 2016-04-24 MED ORDER — METRONIDAZOLE 500 MG PO TABS: 500.0000 mg | ORAL_TABLET | Freq: Two times a day (BID) | ORAL | 0 refills | Status: DC

## 2016-04-24 MED FILL — FERROUS SULFATE 325 MG TAB: 325 (65 FE) | 30 days supply | Qty: 30 | Fill #0

## 2016-04-24 MED FILL — metroNIDAZOLE 500 MG TABS: 500 | 7 days supply | Qty: 14 | Fill #0

## 2016-04-24 NOTE — Progress Notes (Signed)
Annette Daniel, is a 20 y.o. female  ZOX:[id number]:[id number]  [id number]:[id number]  DOB - 09/09/1996  Subjective:  Chief Complaint and HPI: Annette Daniel is a 20 y.o. female here today to establish care and for a follow up visit for irregular spotting.  Last Depo Provera shot was July last year.  After that, she was treated for BV and yeast vaginitis. She had normal periods Jan, Feb, March of this year but then stated spotting daily and has spotted daily since.  She doesn't bleed much except once a month the bleeding gets heavier for a few days like a real period.  She has been to the ED and had STD testing and pelvic exam that was unremarkable.  Tested +BV but not treated.   She is currently not sexually active because she is frustrated by the spotting.  She denies pelvic pain.  No f/c.  No urinary symptoms.  No insurance.   Hgb=10.6; 2 years ago BV diagnosed and not treated 02/2016  ED notes and labs 2017 reviewed.     ROS:   Constitutional:  No f/c, No night sweats, No unexplained weight loss. EENT:  No vision changes, No blurry vision, No hearing changes. No mouth, throat, or ear problems.  Respiratory: No cough, No SOB Cardiac: No CP, no palpitations GI:  No abd pain, No N/V/D. GU: No Urinary s/sx.  +irregular bleeding Musculoskeletal: No joint pain Neuro: No headache, no dizziness, no motor weakness.  Skin: No rash Endocrine:  No polydipsia. No polyuria.  Psych: Denies SI/HI  No problems updated.  ALLERGIES: Allergies  Allergen Reactions  . Aleve [Naproxen Sodium] Swelling  . Aspirin Swelling    PAST MEDICAL HISTORY: Past Medical History:  Diagnosis Date  . Medical history non-contributory   . Vaginal yeast infection     MEDICATIONS AT HOME: Prior to Admission medications   Medication Sig Start Date End Date Taking? Authorizing Provider  ferrous sulfate 325 (65 FE) MG tablet Take 1 tablet (325 mg total) by mouth daily with breakfast. 04/24/16   Anders SimmondsAngela M McClung, PA-C    metroNIDAZOLE (FLAGYL) 500 MG tablet Take 1 tablet (500 mg total) by mouth 2 (two) times daily. 04/24/16   Anders SimmondsAngela M McClung, PA-C  Misc Natural Products (CRANBERRY/PROBIOTIC PO) Take 1 tablet by mouth daily.    Historical Provider, MD     Objective:  EXAM:   Vitals:   04/24/16 1457  BP: 106/72  Pulse: 78  Temp: 98.1 F (36.7 C)  TempSrc: Oral  Weight: 114 lb (51.7 kg)    General appearance : A&OX3. NAD. Non-toxic-appearing HEENT: Atraumatic and Normocephalic.  PERRLA. EOM intact.  TM clear B. Mouth-MMM, post pharynx WNL w/o erythema, No PND. Neck: supple, no JVD. No cervical lymphadenopathy. No thyromegaly Chest/Lungs:  Breathing-non-labored, Good air entry bilaterally, breath sounds normal without rales, rhonchi, or wheezing  CVS: S1 S2 regular, no murmurs, gallops, rubs  Pelvic deferred bc done recently and no changes since Extremities: Bilateral Lower Ext shows no edema, both legs are warm to touch with = pulse throughout Neurology:  CN II-XII grossly intact, Non focal.   Psych:  TP linear. J/I WNL. Normal speech. Appropriate eye contact and affect.  Skin:  No Rash  Data Review Lab Results  Component Value Date   HGBA1C 5.70 05/20/2015     Assessment & Plan   1. Bacterial vaginosis (untreated from June 2017) - metroNIDAZOLE (FLAGYL) 500 MG tablet; Take 1 tablet (500 mg total) by mouth 2 (two) times daily.  Dispense: 14 tablet; Refill: 0  2. Irregular bleeding Possibly still related from having been on Depo Provera 1 year ago. Continue to watch - CBC with Differential/Platelet - Ferritin - Iron and TIBC - TSH - ferrous sulfate 325 (65 FE) MG tablet; Take 1 tablet (325 mg total) by mouth daily with breakfast.  Dispense: 90 tablet; Refill: 1 Apply for orange card/[facility] discount in the event further work-up or referral is needed Increase water intake.  Condoms if she resumes sexual activity.   Patient have been counseled extensively about nutrition and  exercise  Return in about 4 weeks (around 05/22/2016) for f/up with me for irregular bleeding.  The patient was given clear instructions to go to ER or return to medical center if symptoms don't improve, worsen or new problems develop. The patient verbalized understanding. The patient was told to call to get lab results if they haven't heard anything in the next week.     Georgian CoAngela McClung, PA-C [facility] [place], [place] [phone]   04/24/2016, 5:04 PM

## 2016-04-24 NOTE — Patient Instructions (Addendum)
Drink ~80 ounces of water daily Start Iron tablets daily Take meds for Bacterial vaginosis Take a multi-vitamin daily

## 2016-04-27 ENCOUNTER — Telehealth: Payer: Self-pay

## 2016-04-27 NOTE — Telephone Encounter (Signed)
-----   Message from Anders SimmondsAngela M McClung, [place] sent at 04/27/2016  9:14 AM EDT ----- Please call patient.  Her thyroid was normal.  Her Iron is definitely low.  Have her take Iron twice daily (I told her once daily) and to drink lots of water. We will recheck this at her follow up.   Thanks, Georgian CoAngela McClung, PA-C

## 2016-04-27 NOTE — Telephone Encounter (Signed)
Clld pt - advsd of lab results; increase Iron tablets to 2 a day instead of one; will recheck Iron at follow up. Pt stated she understood and had no questions at this time.

## 2017-03-15 ENCOUNTER — Other Ambulatory Visit: Payer: Self-pay | Admitting: Lab

## 2017-03-15 DIAGNOSIS — N926 Irregular menstruation, unspecified: Secondary | ICD-10-CM

## 2017-03-26 ENCOUNTER — Ambulatory Visit (HOSPITAL_COMMUNITY): Payer: Self-pay

## 2017-05-07 ENCOUNTER — Encounter: Payer: Self-pay | Admitting: Obstetrics & Gynecology

## 2017-10-17 ENCOUNTER — Encounter (HOSPITAL_COMMUNITY): Payer: Self-pay | Admitting: Emergency Medicine

## 2017-10-17 ENCOUNTER — Ambulatory Visit (HOSPITAL_COMMUNITY)
Admission: EM | Admit: 2017-10-17 | Discharge: 2017-10-17 | Disposition: A | Discharge status: Home / Self Care | Payer: Self-pay | Attending: Family Medicine | Admitting: Family Medicine

## 2017-10-17 ENCOUNTER — Other Ambulatory Visit: Payer: Self-pay

## 2017-10-17 DIAGNOSIS — Z8619 Personal history of other infectious and parasitic diseases: Secondary | ICD-10-CM | POA: Insufficient documentation

## 2017-10-17 DIAGNOSIS — R109 Unspecified abdominal pain: Secondary | ICD-10-CM | POA: Insufficient documentation

## 2017-10-17 DIAGNOSIS — Z87891 Personal history of nicotine dependence: Secondary | ICD-10-CM | POA: Insufficient documentation

## 2017-10-17 DIAGNOSIS — Z886 Allergy status to analgesic agent status: Secondary | ICD-10-CM | POA: Insufficient documentation

## 2017-10-17 LAB — POCT URINALYSIS DIP (DEVICE)
Glucose, UA: NEGATIVE mg/dL
HGB URINE DIPSTICK: NEGATIVE
LEUKOCYTES UA: NEGATIVE
Nitrite: NEGATIVE
PH: 5.5 (ref 5.0–8.0)
Protein, ur: 30 mg/dL — AB
Urobilinogen, UA: 0.2 mg/dL (ref 0.0–1.0)

## 2017-10-17 LAB — POCT PREGNANCY, URINE: Preg Test, Ur: NEGATIVE

## 2017-10-17 NOTE — ED Provider Notes (Signed)
[facility]    CSN: [id number] Arrival date & time: 10/17/17  1614     History   Chief Complaint Chief Complaint  Patient presents with  . Abdominal Cramping    HPI Annette Daniel is a 22 y.o. female no significant past medical history presenting with concern that her menstrual cycle is 1 week late.  This morning she had a 30-minute episode of abdominal cramping that was intense and felt like she had knots in her stomach.  She wrapped up and he had a blanket and symptoms subsided in about 30 minutes.  She states that she is not on any form of birth control, previously has used pills and Depo and did not like them.  She states that prior to being on birth control she used it take something for her cramps because they were always bad.  She states that she is typically pretty regular, although in December menstrual cycle was 2 days late.  She denies any current abdominal pain, nausea, vomiting, fevers, back pain.  She denies any urinary symptoms of increased frequency, dysuria, urgency, incomplete voiding.  She does endorse a milky discharge 2 days ago that has since resolved.  HPI  Past Medical History:  Diagnosis Date  . Medical history non-contributory   . Vaginal yeast infection     There are no active problems to display for this patient.   Past Surgical History:  Procedure Laterality Date  . NO PAST SURGERIES      OB History    Gravida Para Term Preterm AB Living   0 0 0 0 0 0   SAB TAB Ectopic Multiple Live Births   0 0 0 0         Home Medications    Prior to Admission medications   Medication Sig Start Date End Date Taking? Authorizing Provider  ferrous sulfate 325 (65 FE) MG tablet Take 1 tablet (325 mg total) by mouth daily with breakfast. 04/24/16   Anders Simmonds, PA-C  metroNIDAZOLE (FLAGYL) 500 MG tablet Take 1 tablet (500 mg total) by mouth 2 (two) times daily. 04/24/16   Anders Simmonds, PA-C  Misc Natural Products (CRANBERRY/PROBIOTIC  PO) Take 1 tablet by mouth daily.    [provider]    Family History Family History  Problem Relation Age of Onset  . Multiple sclerosis Father   . Hypertension Mother     Social History Social History   Tobacco Use  . Smoking status: Former Smoker    Last attempt to quit: 03/04/2016    Years since quitting: 1.6  . Smokeless tobacco: Never Used  . Tobacco comment: black and mild  Substance Use Topics  . Alcohol use: Yes    Alcohol/week: 0.0 oz  . Drug use: No     Allergies   Aleve [naproxen sodium] and Aspirin   Review of Systems Review of Systems  Constitutional: Negative for fever.  Respiratory: Negative for shortness of breath.   Cardiovascular: Negative for chest pain.  Gastrointestinal: Positive for abdominal pain. Negative for diarrhea, nausea and vomiting.  Genitourinary: Positive for vaginal discharge. Negative for dysuria, flank pain, genital sores, hematuria, menstrual problem, vaginal bleeding and vaginal pain.  Musculoskeletal: Negative for back pain.  Skin: Negative for rash.  Neurological: Negative for dizziness, light-headedness and headaches.     Physical Exam Triage Vital Signs ED Triage Vitals  Enc Vitals Group     BP 10/17/17 1745 110/68     Pulse Rate 10/17/17 1745  64     Resp 10/17/17 1745 17     Temp 10/17/17 1745 98.5 F (36.9 C)     Temp Source 10/17/17 1745 Oral     SpO2 10/17/17 1745 100 %     Weight 10/17/17 1747 119 lb (54 kg)     Height 10/17/17 1747 5' 1.5" (1.562 m)     Head Circumference --      Peak Flow --      Pain Score 10/17/17 1745 0     Pain Loc --      Pain Edu? --      Excl. in GC? --    No data found.  Updated Vital Signs BP 110/68 (BP Location: Left Arm)   Pulse 64   Temp 98.5 F (36.9 C) (Oral)   Resp 17   Ht 5' 1.5" (1.562 m)   Wt 119 lb (54 kg)   LMP 09/12/2017   SpO2 100%   BMI 22.12 kg/m    Physical Exam  Constitutional: She appears well-developed and well-nourished. No distress.    HENT:  Head: Normocephalic and atraumatic.  Eyes: Conjunctivae are normal.  Neck: Neck supple.  Cardiovascular: Normal rate and regular rhythm.  Pulmonary/Chest: Effort normal and breath sounds normal. No respiratory distress.  Clear to auscultation bilaterally  Abdominal: Soft. There is tenderness.  Abdomen soft, patient does not guard during exam, mild tenderness to palpation in left upper quadrant, negative Murphy's, negative McBurney's, negative Rovsing's, no rebound.  Negative CVA tenderness  Genitourinary:  Genitourinary Comments: Exam deferred as patient lacking pelvic pain, denies any rashes, lesions, bumps.  Musculoskeletal: She exhibits no edema.  Neurological: She is alert.  Skin: Skin is warm and dry.  Psychiatric: She has a normal mood and affect.  Nursing note and vitals reviewed.    UC Treatments / Results  Labs (all labs ordered are listed, but only abnormal results are displayed) Labs Reviewed  POCT URINALYSIS DIP (DEVICE) - Abnormal; Notable for the following components:      Result Value   Bilirubin Urine SMALL (*)    Ketones, ur TRACE (*)    Protein, ur 30 (*)    All other components within normal limits  POCT PREGNANCY, URINE  CERVICOVAGINAL ANCILLARY ONLY    EKG  EKG Interpretation None       Radiology No results found.  Procedures Procedures (including critical care time)  Medications Ordered in UC Medications - No data to display   Initial Impression / Assessment and Plan / UC Course  I have reviewed the triage vital signs and the nursing notes.  Pertinent labs & imaging results that were available during my care of the patient were reviewed by me and considered in my medical decision making (see chart for details).     Pregnancy test negative, urine negative for signs of infection of the fissure signs of possible dehydration.  Advised patient to increase her fluid intake and try to get her urine to be light yellow to clear.  Also  testing for STDs given discharge.  Will call patient to inform of results.  Patient without current abdominal pain.  We will continue to monitor.  Advised her to do at home pregnancy test or return here in another week menstrual cycle does not again.  May follow-up with women's clinic cycle does not return.  Advised her abdominal pain worsens, becomes more frequent, becomes persistent please return for further evaluation. Discussed strict return precautions. Patient verbalized understanding and is agreeable with  plan.   Final Clinical Impressions(s) / UC Diagnoses   Final diagnoses:  Abdominal cramping    ED Discharge Orders    None       Controlled Substance Prescriptions [place] Controlled Substance Registry consulted? Not Applicable   Lew DawesWieters, Tawnie Ehresman C, [place]-C 10/17/17 2123

## 2017-10-17 NOTE — ED Triage Notes (Signed)
Pt. Stated, Im a week late with my pweriod but I was a week late in dec also.  This morning I started having stomach cramping for one hour half so bad I couldn't go to work .  Pt did not take anything for pain.  "might be pregnant"

## 2017-10-17 NOTE — Discharge Instructions (Signed)
Pregnancy test was negative. Urine did not show signs of infection, but possibly some dehydration. Please drink plenty of water to make urine clear to light yellow.   We will check vaginal swab for STD's, yeast and BV.  Please take Tylenol and Ibuprofen for the pain if it returns. Please follow up if it returns and becomes more persistent.

## 2017-10-18 LAB — CERVICOVAGINAL ANCILLARY ONLY
Bacterial vaginitis: NEGATIVE
Candida vaginitis: NEGATIVE
Chlamydia: NEGATIVE
Neisseria Gonorrhea: NEGATIVE
TRICH (WINDOWPATH): NEGATIVE

## 2020-09-30 ENCOUNTER — Other Ambulatory Visit: Payer: Self-pay

## 2024-04-21 ENCOUNTER — Ambulatory Visit: Payer: Self-pay | Admitting: Physician Assistant

## 2024-04-21 ENCOUNTER — Encounter: Payer: Self-pay | Admitting: Physician Assistant

## 2024-04-21 VITALS — BP 109/69 | HR 85 | Ht 61.0 in | Wt 113.0 lb

## 2024-04-21 DIAGNOSIS — Z021 Encounter for pre-employment examination: Secondary | ICD-10-CM

## 2024-04-21 DIAGNOSIS — Z111 Encounter for screening for respiratory tuberculosis: Secondary | ICD-10-CM

## 2024-04-21 NOTE — Patient Instructions (Signed)
 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 We discussed your current health status, including your occasional use of iron gummies, and performed a tuberculosis screening test.  YOUR PLAN:  -TUBERCULOSIS SCREENING (PPD TEST): Tuberculosis is a bacterial infection that primarily affects the lungs. We administered a PPD test to screen for tuberculosis. You will need to return in 48 hours to have the test read. If the test is positive, we may need to do a chest x-ray to confirm the diagnosis.  -IRON SUPPLEMENTATION: Iron is a mineral that your body needs to make hemoglobin, which carries oxygen in your blood. You mentioned occasionally taking iron gummies due to low iron levels. We discussed the risks of taking too much iron and offered a blood test to check your iron levels if you want.   Tuberculin Skin Test Why am I having this test? The tuberculin skin test is used to check if a person has been exposed to the bacteria that causes tuberculosis (Mycobacterium tuberculosis). Tuberculosis (TB) is a bacterial infection that usually affects the lungs but can affect other parts of the body. You may have a tuberculin skin test if: You have possible symptoms of TB, such as: Coughing up blood, mucus from the lungs (sputum), or both. A cough that lasts three weeks or longer. Chest pain, or pain while breathing or coughing. Loss of appetite or unexplained weight loss. Tiredness (fatigue) and weakness. Fever, sweating, and chills. You are at high risk of getting TB. You may be at high risk if you: Inject illegal drugs or share needles. Have HIV or other diseases that affect the body's disease-fighting system (immune system). Work in a health care facility. Live in a high-risk community, such as a homeless shelter, nursing home, or correctional facility. Have had contact with someone who has TB. Are from or have traveled to a country where TB is  common. If you are at high risk, you may need to have regular TB screenings. TB screening may be required when starting a new job, such as becoming a Scientist, forensic or a Runner, broadcasting/film/video. Colleges or universities may require TB screening for new students. What is being tested? This test checks if you have TB antibodies in your body. Antibodies are part of your immune system. After you get an infection, your body makes antibodies that stay in your body after you recover and protect you from getting the same infection again. Tell a health care provider about: Any allergies you have. Any previous TB infection or exposure to someone with an active TB infection. All medicines you are taking, including vitamins, herbs, eye drops, creams, and over-the-counter medicines. Whether you have had the tuberculosis vaccine. Any blood disorders you have. Any surgeries you have had. Any medical conditions you have. Whether you are pregnant or may be pregnant. What happens during the test?  Your health care provider will inject a solution called purified protein derivative (PPD) under the first layer of skin on your arm. This causes a small, blister-like bump to form over the area temporarily. PPD is made from the bacteria that causes TB. PPD causes your immune system to react, but it does not get you sick with TB. You may feel mild stinging as this happens. Afterward, the area may itch or burn. How are the results reported? Your test results will be reported as either positive or negative. To get your test results, you will need to see your health care provider  again within 2-3 days after you received the injection. It is important to follow your health care provider's instructions about when to be seen again. If you are not seen within 2-3 days, you may need to repeat the test. At your follow-up visit, your health care provider will measure the area where the PPD was injected to see if the bump has gotten larger. What  do the results mean? A negative result means that you do not have antibodies and that it is unlikely that you have TB or that you have been exposed to TB bacteria. If your test is negative, the bump will have disappeared or be small. This test may be repeated, or you may have a blood test to check for TB. This is because your body may not react to the tuberculin skin test until several weeks after exposure to TB bacteria. A positive result means that you do have antibodies and that you have been exposed to TB. If your test is positive, the bump will have become larger, and you may need more tests to determine if you have: Active TB, also called TB disease. This means that you have TB symptoms and your infection can spread to another person (you are contagious). Latent TB. This means that you do not have any symptoms of TB and you are not contagious. Latent TB can turn into active TB. Talk with your health care provider about what your results mean. In some cases, your health care provider may do more testing to confirm the results. Questions to ask your health care provider Ask your health care provider, or the department that is doing the test: When will my results be ready? How will I get my results? What are my treatment options? What other tests do I need? What are my next steps? Summary The tuberculin skin test is used to check whether a person has been exposed to the bacteria that causes tuberculosis (TB). Your health care provider will inject a solution known as purified protein derivative (PPD)under the first layer of skin on your arm. After 2-3 days, your health care provider will measure the area where the PPD was injected to see if the bump has gotten larger. Your results will be reported as positive or negative. A positive result means that you have been exposed to TB. A negative result means that it is unlikely that you have TB or that you have been exposed to TB bacteria. This  information is not intended to replace advice given to you by your health care provider. Make sure you discuss any questions you have with your health care provider. Document Revised: 10/04/2021 Document Reviewed: 10/04/2021 Elsevier Patient Education  2024 [employer].

## 2024-04-21 NOTE — Progress Notes (Signed)
 New Patient Office Visit  Subjective    Patient ID: Annette Daniel, female    DOB: Jul 30, 1996  Age: 28 y.o. MRN: [id number]  CC:  Chief Complaint  Patient presents with   Annual Exam   Discussed the use of AI scribe software for clinical note transcription with the patient, who gave verbal consent to proceed.  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  She occasionally takes an iron gummy due to previously low iron levels but has not taken it in the last two weeks. She often forgets to take it. She does not take any other medications. No history of back or knee pain.  No other concerns at this time.        Outpatient Encounter Medications as of 04/21/2024  Medication Sig   ferrous sulfate  325 (65 FE) MG tablet Take 1 tablet (325 mg total) by mouth daily with breakfast.   [DISCONTINUED] metroNIDAZOLE  (FLAGYL ) 500 MG tablet Take 1 tablet (500 mg total) by mouth 2 (two) times daily. (Patient not taking: Reported on 04/21/2024)   [DISCONTINUED] Misc Natural Products (CRANBERRY/PROBIOTIC PO) Take 1 tablet by mouth daily. (Patient not taking: Reported on 04/21/2024)   No facility-administered encounter medications on file as of 04/21/2024.    Past Medical History:  Diagnosis Date   Medical history non-contributory    Vaginal yeast infection     Past Surgical History:  Procedure Laterality Date   NO PAST SURGERIES      Family History  Problem Relation Age of Onset   Multiple sclerosis Father    Hypertension Mother     Social History   Socioeconomic History   Marital status: Single    Spouse name: Not on file   Number of children: Not on file   Years of education: Not on file   Highest education level: Not on file  Occupational History   Not on file  Tobacco Use   Smoking status: Former    Current packs/day: 0.00    Types: Cigarettes    Quit date: 03/04/2016     Years since quitting: 8.1   Smokeless tobacco: Never   Tobacco comments:    black and mild  Substance and Sexual Activity   Alcohol use: Yes    Alcohol/week: 0.0 standard drinks of alcohol   Drug use: No   Sexual activity: Yes    Birth control/protection: None  Other Topics Concern   Not on file  Social History Narrative   Not on file   Social Drivers of Health   Financial Resource Strain: Not on file  Food Insecurity: Not on file  Transportation Needs: Not on file  Physical Activity: Not on file  Stress: Not on file  Social Connections: Not on file  Intimate Partner Violence: Not on file    Review of Systems  Constitutional: Negative.   HENT: Negative.    Eyes: Negative.   Respiratory:  Negative for cough and shortness of breath.   Cardiovascular:  Negative for chest pain.  Gastrointestinal: Negative.   Genitourinary: Negative.   Musculoskeletal: Negative.   Skin: Negative.   Neurological: Negative.   Endo/Heme/Allergies: Negative.   Psychiatric/Behavioral: Negative.          Objective    BP 109/69 (BP Location: Left Arm, Patient Position: Sitting)   Pulse 85   Ht 5' 1 (1.549 m)   Wt 113 lb (51.3 kg)  LMP 03/30/2024 (Exact Date)   BMI 21.35 kg/m   Physical Exam Vitals and nursing note reviewed.    GENERAL: Alert, cooperative, well developed, no acute distress. HEENT: Normocephalic, normal oropharynx, moist mucous membranes. CHEST: Clear to auscultation bilaterally, no wheezes, rhonchi, or crackles. CARDIOVASCULAR: Normal heart rate and rhythm, S1 and S2 normal without murmurs. EXTREMITIES: No cyanosis or edema. NEUROLOGICAL: Cranial nerves grossly intact, moves all extremities without gross motor or sensory deficit.    Assessment & Plan:   Problem List Items Addressed This Visit   None Visit Diagnoses       Screening-pulmonary TB    -  Primary   Relevant Orders   PPD     Pre-employment health screening examination          1.  Pre-employment health screening examination Paperwork completed on patient's behalf.  Patient declines screening for iron deficiency anemia.  Encouraged patient to establish care with primary care provider.  2. Screening-pulmonary TB (Primary) TB testing skin test, patient to follow-up with mobile unit in 2 days for test to be read.  Patient understands and agrees - PPD   I have reviewed the patient's medical history (PMH, PSH, Social History, Family History, Medications, and allergies) , and have been updated if relevant. I spent 20 minutes reviewing chart and  face to face time with patient.     Return in about 2 days (around 04/23/2024) for With [facility].   Kirk RAMAN Mayers, PA-C

## 2024-04-23 LAB — TB SKIN TEST
Induration: 0 mm
TB Skin Test: NEGATIVE
